# Patient Record
Sex: Female | Born: 1995
Health system: Southern US, Community
[De-identification: ages and names within clinical notes are randomized; demographics above are authoritative.]

## PROBLEM LIST (undated history)

## (undated) DIAGNOSIS — F32A Depression, unspecified: Secondary | ICD-10-CM

## (undated) DIAGNOSIS — U071 COVID-19: Secondary | ICD-10-CM

## (undated) DIAGNOSIS — D649 Anemia, unspecified: Secondary | ICD-10-CM

## (undated) DIAGNOSIS — R519 Headache, unspecified: Secondary | ICD-10-CM

## (undated) DIAGNOSIS — F419 Anxiety disorder, unspecified: Secondary | ICD-10-CM

## (undated) HISTORY — DX: Headache, unspecified: R51.9

## (undated) HISTORY — DX: COVID-19: U07.1

## (undated) HISTORY — DX: Anemia, unspecified: D64.9

## (undated) HISTORY — PX: NO PAST SURGERIES: SHX2092

## (undated) HISTORY — DX: Anxiety disorder, unspecified: F41.9

## (undated) HISTORY — DX: Depression, unspecified: F32.A

---
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
## 2018-08-18 DIAGNOSIS — H52221 Regular astigmatism, right eye: Secondary | ICD-10-CM | POA: Diagnosis not present

## 2018-08-18 DIAGNOSIS — H5212 Myopia, left eye: Secondary | ICD-10-CM | POA: Diagnosis not present

## 2019-01-02 DIAGNOSIS — Z681 Body mass index (BMI) 19 or less, adult: Secondary | ICD-10-CM | POA: Diagnosis not present

## 2019-01-02 DIAGNOSIS — N760 Acute vaginitis: Secondary | ICD-10-CM | POA: Diagnosis not present

## 2019-01-02 DIAGNOSIS — Z113 Encounter for screening for infections with a predominantly sexual mode of transmission: Secondary | ICD-10-CM | POA: Diagnosis not present

## 2019-01-02 DIAGNOSIS — Z01419 Encounter for gynecological examination (general) (routine) without abnormal findings: Secondary | ICD-10-CM | POA: Diagnosis not present

## 2019-01-02 DIAGNOSIS — Z3202 Encounter for pregnancy test, result negative: Secondary | ICD-10-CM | POA: Diagnosis not present

## 2019-12-20 ENCOUNTER — Ambulatory Visit: Payer: Self-pay | Attending: Internal Medicine

## 2019-12-20 ENCOUNTER — Ambulatory Visit: Payer: Self-pay

## 2020-01-14 ENCOUNTER — Ambulatory Visit: Payer: Self-pay | Attending: Internal Medicine

## 2020-01-17 ENCOUNTER — Ambulatory Visit: Payer: Self-pay

## 2020-01-17 ENCOUNTER — Ambulatory Visit: Payer: Self-pay | Attending: Internal Medicine

## 2020-04-21 DIAGNOSIS — L989 Disorder of the skin and subcutaneous tissue, unspecified: Secondary | ICD-10-CM | POA: Diagnosis not present

## 2020-04-21 DIAGNOSIS — G43009 Migraine without aura, not intractable, without status migrainosus: Secondary | ICD-10-CM | POA: Diagnosis not present

## 2020-04-21 DIAGNOSIS — Z13220 Encounter for screening for lipoid disorders: Secondary | ICD-10-CM | POA: Diagnosis not present

## 2020-04-21 DIAGNOSIS — Z0000 Encounter for general adult medical examination without abnormal findings: Secondary | ICD-10-CM | POA: Diagnosis not present

## 2020-04-21 DIAGNOSIS — F411 Generalized anxiety disorder: Secondary | ICD-10-CM | POA: Diagnosis not present

## 2020-05-30 DIAGNOSIS — R718 Other abnormality of red blood cells: Secondary | ICD-10-CM | POA: Diagnosis not present

## 2020-08-05 DIAGNOSIS — J069 Acute upper respiratory infection, unspecified: Secondary | ICD-10-CM | POA: Diagnosis not present

## 2020-08-05 DIAGNOSIS — R079 Chest pain, unspecified: Secondary | ICD-10-CM | POA: Diagnosis not present

## 2020-08-20 DIAGNOSIS — R309 Painful micturition, unspecified: Secondary | ICD-10-CM | POA: Diagnosis not present

## 2020-08-20 DIAGNOSIS — Z113 Encounter for screening for infections with a predominantly sexual mode of transmission: Secondary | ICD-10-CM | POA: Diagnosis not present

## 2020-08-20 DIAGNOSIS — N898 Other specified noninflammatory disorders of vagina: Secondary | ICD-10-CM | POA: Diagnosis not present

## 2020-09-02 DIAGNOSIS — F419 Anxiety disorder, unspecified: Secondary | ICD-10-CM | POA: Diagnosis not present

## 2020-11-26 DIAGNOSIS — H52223 Regular astigmatism, bilateral: Secondary | ICD-10-CM | POA: Diagnosis not present

## 2021-04-03 ENCOUNTER — Emergency Department (HOSPITAL_COMMUNITY)
Admission: EM | Admit: 2021-04-03 | Discharge: 2021-04-03 | Disposition: A | Discharge status: Home / Self Care | Payer: 59 | Attending: Emergency Medicine | Admitting: Emergency Medicine

## 2021-04-03 ENCOUNTER — Encounter (HOSPITAL_COMMUNITY): Payer: Self-pay

## 2021-04-03 ENCOUNTER — Other Ambulatory Visit (INDEPENDENT_AMBULATORY_CARE_PROVIDER_SITE_OTHER)
Admission: EM | Admit: 2021-04-03 | Discharge: 2021-04-04 | Disposition: A | Discharge status: Home / Self Care | Payer: 59 | Source: Home / Self Care | Attending: Psychiatry | Admitting: Psychiatry

## 2021-04-03 ENCOUNTER — Other Ambulatory Visit (HOSPITAL_COMMUNITY)
Admission: RE | Admit: 2021-04-03 | Discharge: 2021-04-03 | Disposition: A | Discharge status: Home / Self Care | Payer: 59 | Source: Ambulatory Visit | Attending: Nurse Practitioner | Admitting: Nurse Practitioner

## 2021-04-03 ENCOUNTER — Other Ambulatory Visit: Payer: Self-pay

## 2021-04-03 DIAGNOSIS — F411 Generalized anxiety disorder: Secondary | ICD-10-CM

## 2021-04-03 DIAGNOSIS — Z7289 Other problems related to lifestyle: Secondary | ICD-10-CM | POA: Insufficient documentation

## 2021-04-03 DIAGNOSIS — F332 Major depressive disorder, recurrent severe without psychotic features: Secondary | ICD-10-CM | POA: Diagnosis not present

## 2021-04-03 DIAGNOSIS — F419 Anxiety disorder, unspecified: Secondary | ICD-10-CM

## 2021-04-03 DIAGNOSIS — F322 Major depressive disorder, single episode, severe without psychotic features: Secondary | ICD-10-CM | POA: Insufficient documentation

## 2021-04-03 DIAGNOSIS — R000 Tachycardia, unspecified: Secondary | ICD-10-CM | POA: Insufficient documentation

## 2021-04-03 DIAGNOSIS — Z79899 Other long term (current) drug therapy: Secondary | ICD-10-CM | POA: Insufficient documentation

## 2021-04-03 DIAGNOSIS — Z20822 Contact with and (suspected) exposure to covid-19: Secondary | ICD-10-CM | POA: Diagnosis not present

## 2021-04-03 DIAGNOSIS — R45851 Suicidal ideations: Secondary | ICD-10-CM | POA: Diagnosis not present

## 2021-04-03 LAB — BASIC METABOLIC PANEL
Anion gap: 8 (ref 5–15)
BUN: 10 mg/dL (ref 6–20)
CO2: 23 mmol/L (ref 22–32)
Calcium: 8.7 mg/dL — ABNORMAL LOW (ref 8.9–10.3)
Chloride: 106 mmol/L (ref 98–111)
Creatinine, Ser: 0.63 mg/dL (ref 0.44–1.00)
GFR, Estimated: >60 mL/min (ref >60)
Glucose, Bld: 100 mg/dL — ABNORMAL HIGH (ref 70–99)
Potassium: 3.6 mmol/L (ref 3.5–5.1)
Sodium: 137 mmol/L (ref 135–145)

## 2021-04-03 LAB — CBC
HCT: 37.8 % (ref 36.0–46.0)
Hemoglobin: 12.2 g/dL (ref 12.0–15.0)
MCH: 25.2 pg — ABNORMAL LOW (ref 26.0–34.0)
MCHC: 32.3 g/dL (ref 30.0–36.0)
MCV: 77.9 fL — ABNORMAL LOW (ref 80.0–100.0)
Platelets: 290 10*3/uL (ref 150–400)
RBC: 4.85 MIL/uL (ref 3.87–5.11)
RDW: 14.3 % (ref 11.5–15.5)
WBC: 7.4 10*3/uL (ref 4.0–10.5)
nRBC: 0 % (ref 0.0–0.2)

## 2021-04-03 LAB — RESP PANEL BY RT-PCR (FLU A&B, COVID) ARPGX2
Influenza A by PCR: NEGATIVE
Influenza B by PCR: NEGATIVE
SARS Coronavirus 2 by RT PCR: NEGATIVE

## 2021-04-03 LAB — LIPID PANEL
Cholesterol: 216 mg/dL — ABNORMAL HIGH (ref 0–200)
HDL: 79 mg/dL (ref >40)
LDL Cholesterol: 131 mg/dL — ABNORMAL HIGH (ref 0–99)
Total CHOL/HDL Ratio: 2.7 RATIO
Triglycerides: 32 mg/dL (ref <150)
VLDL: 6 mg/dL (ref 0–40)

## 2021-04-03 LAB — RAPID URINE DRUG SCREEN, HOSP PERFORMED
Amphetamines: NOT DETECTED
Barbiturates: NOT DETECTED
Benzodiazepines: NOT DETECTED
Cocaine: NOT DETECTED
Opiates: NOT DETECTED
Tetrahydrocannabinol: NOT DETECTED

## 2021-04-03 LAB — I-STAT BETA HCG BLOOD, ED (MC, WL, AP ONLY): I-stat hCG, quantitative: <5 m[IU]/mL (ref <5)

## 2021-04-03 LAB — ETHANOL: Alcohol, Ethyl (B): <10 mg/dL (ref <10)

## 2021-04-03 MED ORDER — TRAZODONE HCL 50 MG PO TABS
50.0000 mg | ORAL_TABLET | Freq: Every evening | ORAL | Status: DC | PRN
Start: 2021-04-03 — End: 2021-04-04
  Administered 2021-04-03: 50 mg via ORAL
  Filled 2021-04-03: qty 1

## 2021-04-03 MED ORDER — HYDROXYZINE HCL 25 MG PO TABS: 25.0000 mg | ORAL_TABLET | Freq: Three times a day (TID) | ORAL | Status: DC | PRN

## 2021-04-03 MED ORDER — ALUM & MAG HYDROXIDE-SIMETH 200-200-20 MG/5ML PO SUSP: 30.0000 mL | ORAL | Status: DC | PRN

## 2021-04-03 MED ORDER — MAGNESIUM HYDROXIDE 400 MG/5ML PO SUSP: 30.0000 mL | Freq: Every day | ORAL | Status: DC | PRN

## 2021-04-03 MED ORDER — FLUOXETINE HCL 10 MG PO CAPS
10.0000 mg | ORAL_CAPSULE | Freq: Every day | ORAL | Status: DC
  Administered 2021-04-04: 10 mg via ORAL
  Filled 2021-04-03: qty 1

## 2021-04-03 MED ORDER — ACETAMINOPHEN 325 MG PO TABS
650.0000 mg | ORAL_TABLET | Freq: Four times a day (QID) | ORAL | Status: DC | PRN
Start: 2021-04-03 — End: 2021-04-04

## 2021-04-03 NOTE — ED Provider Notes (Signed)
Behavioral Health Admission H&P [facility] & OBS)  Date: 04/03/21 Patient Name: Bethany Cox MRN: [id number] Chief Complaint: No chief complaint on file.     Diagnoses:  Final diagnoses:  MDD (major depressive disorder), single episode, severe , no psychosis ([place])  GAD (generalized anxiety disorder)    HPI: Bethany Cox is a 25 y.o. female who presented to [facility] due to worsening depression, anxiety, and SI without a plan. Patient was recommended or admission to [facility] by Brooke-Leevy-[place], NP.   On evaluation, the patient is alert and oriented x4.  She is pleasant and cooperative.  Her eye contact is good.  Speech is clear and coherent, normal pace, normal volume.  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  He states that she lives with her mother and her brother.  She reports a long-term relationship with her boyfriend of over 10 years.  She reports some difficulty in the relationship.  She reports this has triggered her anxiety and thoughts of self-harm.  She reports that her father died suddenly from a heart attack in September 26, 2017.  She states that she has had a difficult time since his death.  She reports flashbacks related to his death.  States that she continues to avoid going through the emergency department because it brings back memories of his death.  Patient reports having panic symptoms while at work.  States that she "fell out on the floor."  She reports panic symptoms of tremors, shortness of breath, and tachycardia.  Patient states that she sleeps approximately 3 to 4 hours per night and states that she has difficulty falling asleep and often does not fall asleep until around 4 AM and then has to get up around 7 AM.  She reports that her appetite fluctuates.  States that she lost a lot of weight when her father passed away in 09-26-17 but has recently started gaining weight back.  She endorses distractibility, impulsivity, and irritable  mood.  She denies risky behaviors, financial extravagance, increased self confidence, and excessive talking. Patient states that she has never received assistance for depression or anxiety until recently.  States that she recently sought counseling through [facility].  She has met with a counselor twice and the counselor recommended that she go to her PCP for possible medications.  States that she has never taken any psychotropics.  Patient reports intermittent suicidal ideations without intent or plan.  She denies a history of suicide attempts.  She denies homicidal ideations.  She denies auditory and visual hallucinations.  She does not appear to be responding to internal stimuli.  She reports occasional alcohol use.  She reports using marijuana in the past, but does not use on a regular basis.  Denies use of other substances.   Associated Signs/Symptoms: Depression Symptoms:  depressed mood, anhedonia, insomnia, fatigue, feelings of worthlessness/guilt, hopelessness, suicidal thoughts without plan, anxiety, panic attacks, loss of energy/fatigue,  Duration of Depression Symptoms: Greater than two weeks  (Hypo) Manic Symptoms:  Distractibility, Impulsivity, Irritable Mood, Anxiety Symptoms:  Excessive Worry, Panic Symptoms,-collapsed on floor, shortness of breath, tachycardia Psychotic Symptoms:   Denies Duration of Psychotic Symptoms: No data recorded PTSD Symptoms: Had a traumatic exposure:  father passed away suddenly due to MI in 26-Sep-2017 Re-experiencing:  Flashbacks Nightmares Avoidance:  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    PHQ 2-9:   [place] [facility] from 04/03/2021 in [place]  High Risk        Total Time spent with patient: 45 minutes  Musculoskeletal  Strength & Muscle Tone: within normal limits Gait & Station: normal Patient leans: N/A  Psychiatric Specialty Exam  Presentation General  Appearance: Appropriate for Environment; Neat Eye Contact:Good Speech:Clear and Coherent Speech Volume:Normal Handedness:No data recorded  Mood and Affect  Mood:Anxious; Depressed Affect:Congruent  Thought Process  Thought Processes:Coherent Descriptions of Associations:Intact Orientation:Full (Time, Place and Person) Thought Content:WDL Diagnosis of Schizophrenia or Schizoaffective disorder in past: No   Hallucinations:Hallucinations: None Ideas of Reference:None Suicidal Thoughts:Suicidal Thoughts: Yes, Active SI Active Intent and/or Plan: Without Intent; Without Plan Homicidal Thoughts:Homicidal Thoughts: No  Sensorium  Memory:Immediate Good; Recent Good; Remote Good Judgment:Fair Insight:Fair  Executive Functions  Concentration:Good Attention Span:Good Recall:Good Fund of Knowledge:Good Language:Good  Psychomotor Activity  Psychomotor Activity:Psychomotor Activity: Normal  Assets  Assets:Communication Skills; Desire for Improvement; Financial Resources/Insurance; Housing; Physical Health; Transportation  Sleep  Sleep:Sleep: Poor Number of Hours of Sleep: 4  Nutritional Assessment (For OBS and [facility]BC admissions only) Has the patient had a weight loss or gain of 10 pounds or more in the last 3 months?: No Has the patient had a decrease in food intake/or appetite?: No Does the patient have dental problems?: No Does the patient have eating habits or behaviors that may be indicators of an eating disorder including binging or inducing vomiting?: No Has the patient recently lost weight without trying?: No Has the patient been eating poorly because of a decreased appetite?: No Malnutrition Screening Tool Score: 0   Physical Exam Constitutional:      General: She is not in acute distress.    Appearance: She is not ill-appearing, toxic-appearing or diaphoretic.  HENT:     Head: Normocephalic.     Right Ear: External ear normal.     Left Ear: External ear normal.   Eyes:     Conjunctiva/sclera: Conjunctivae normal.     Pupils: Pupils are equal, round, and reactive to light.  Cardiovascular:     Rate and Rhythm: Normal rate.  Pulmonary:     Effort: Pulmonary effort is normal. No respiratory distress.  Musculoskeletal:        General: Normal range of motion.  Neurological:     Mental Status: She is alert and oriented to person, place, and time.  Psychiatric:        Mood and Affect: Mood is anxious and depressed.        Thought Content: Thought content is not paranoid or delusional. Thought content includes suicidal ideation. Thought content does not include homicidal ideation. Thought content includes suicidal plan.   Review of Systems  Constitutional:  Negative for chills, diaphoresis, fever, malaise/fatigue and weight loss.  HENT:  Negative for congestion.   Respiratory:  Negative for cough and shortness of breath.   Cardiovascular:  Negative for chest pain and palpitations.  Gastrointestinal:  Negative for diarrhea, nausea and vomiting.  Neurological:  Negative for dizziness and seizures.  Psychiatric/Behavioral:  Positive for depression and suicidal ideas. Negative for hallucinations, memory loss and substance abuse. The patient is nervous/anxious and has insomnia.   All other systems reviewed and are negative.  Last menstrual period 03/09/2021. There is no height or weight on file to calculate BMI.  Past Psychiatric History: Denies receiving psychiatric care in the past.   Is the patient at risk to self? Yes  Has the patient been a risk to self in the past 6 months? No .    Has  the patient been a risk to self within the distant past? No   Is the patient a risk to others? No   Has the patient been a risk to others in the past 6 months? No   Has the patient been a risk to others within the distant past? No   Past Medical History: No past medical history on file. No past surgical history on file.  Family History:  Family History   Problem Relation Age of Onset   Thyroid disease Mother     Social History:  Social History   Socioeconomic History   Marital status: Single    Spouse name: Not on file   Number of children: Not on file   Years of education: Not on file   Highest education level: Not on file  Occupational History   Not on file  Tobacco Use   Smoking status: Never   Smokeless tobacco: Never  Vaping Use   Vaping Use: Some days   Substances: Nicotine, Flavoring  Substance and Sexual Activity   Alcohol use: Yes   Drug use: Never   Sexual activity: Not on file  Other Topics Concern   Not on file  Social History Narrative   Not on file   Social Determinants of Health   Financial Resource Strain: Not on file  Food Insecurity: Not on file  Transportation Needs: Not on file  Physical Activity: Not on file  Stress: Not on file  Social Connections: Not on file  Intimate Partner Violence: Not on file    SDOH:  SDOH Screenings   Alcohol Screen: Not on file  Depression (PHQ2-9): Not on file  Financial Resource Strain: Not on file  Food Insecurity: Not on file  Housing: Not on file  Physical Activity: Not on file  Social Connections: Not on file  Stress: Not on file  Tobacco Use: Low Risk    Smoking Tobacco Use: Never   Smokeless Tobacco Use: Never  Transportation Needs: Not on file    Last Labs:  Admission on 04/03/2021, Discharged on 04/03/2021  Component Date Value Ref Range Status   SARS Coronavirus 2 by RT PCR 04/03/2021 NEGATIVE  NEGATIVE Final   Comment: (NOTE) SARS-CoV-2 target nucleic acids are NOT DETECTED.  The SARS-CoV-2 RNA is generally detectable in upper respiratory specimens during the acute phase of infection. The lowest concentration of SARS-CoV-2 viral copies this assay can detect is 138 copies/mL. A negative result does not preclude SARS-Cov-2 infection and should not be used as the sole basis for treatment or other patient management decisions. A negative  result may occur with  improper specimen collection/handling, submission of specimen other than nasopharyngeal swab, presence of viral mutation(s) within the areas targeted by this assay, and inadequate number of viral copies(<138 copies/mL). A negative result must be combined with clinical observations, patient history, and epidemiological information. The expected result is Negative.  Fact Sheet for Patients:  EntrepreneurPulse.com.au  Fact Sheet for Healthcare Providers:  IncredibleEmployment.be  This test is no                          t yet approved or cleared by the [place] FDA and  has been authorized for detection and/or diagnosis of SARS-CoV-2 by FDA under an Emergency Use Authorization (EUA). This EUA will remain  in effect (meaning this test can be used) for the duration of the COVID-19 declaration under Section 564(b)(1) of the Act, 21 U.S.C.section 360bbb-3(b)(1), unless the  authorization is terminated  or revoked sooner.       Influenza A by PCR 04/03/2021 NEGATIVE  NEGATIVE Final   Influenza B by PCR 04/03/2021 NEGATIVE  NEGATIVE Final   Comment: (NOTE) The Xpert Xpress SARS-CoV-2/FLU/RSV plus assay is intended as an aid in the diagnosis of influenza from Nasopharyngeal swab specimens and should not be used as a sole basis for treatment. Nasal washings and aspirates are unacceptable for Xpert Xpress SARS-CoV-2/FLU/RSV testing.  Fact Sheet for Patients: EntrepreneurPulse.com.au  Fact Sheet for Healthcare Providers: IncredibleEmployment.be  This test is not yet approved or cleared by the [place] FDA and has been authorized for detection and/or diagnosis of SARS-CoV-2 by FDA under an Emergency Use Authorization (EUA). This EUA will remain in effect (meaning this test can be used) for the duration of the COVID-19 declaration under Section 564(b)(1) of the Act, 21 U.S.C. section  360bbb-3(b)(1), unless the authorization is terminated or revoked.  Performed at [facility], [place] [address] [place], [place] [address]    Opiates 04/03/2021 NONE DETECTED  NONE DETECTED Final   Cocaine 04/03/2021 NONE DETECTED  NONE DETECTED Final   Benzodiazepines 04/03/2021 NONE DETECTED  NONE DETECTED Final   Amphetamines 04/03/2021 NONE DETECTED  NONE DETECTED Final   Tetrahydrocannabinol 04/03/2021 NONE DETECTED  NONE DETECTED Final   Barbiturates 04/03/2021 NONE DETECTED  NONE DETECTED Final   Comment: (NOTE) DRUG SCREEN FOR MEDICAL PURPOSES ONLY.  IF CONFIRMATION IS NEEDED FOR ANY PURPOSE, NOTIFY LAB WITHIN 5 DAYS.  LOWEST DETECTABLE LIMITS FOR URINE DRUG SCREEN Drug Class                     Cutoff (ng/mL) Amphetamine and metabolites    1000 Barbiturate and metabolites    200 Benzodiazepine                 433 Tricyclics and metabolites     300 Opiates and metabolites        300 Cocaine and metabolites        300 THC                            50 Performed at [facility], [place] [address], [place], [place] [address]    Sodium 04/03/2021 137  135 - 145 mmol/L Final   Potassium 04/03/2021 3.6  3.5 - 5.1 mmol/L Final   Chloride 04/03/2021 106  98 - 111 mmol/L Final   CO2 04/03/2021 23  22 - 32 mmol/L Final   Glucose, Bld 04/03/2021 100 (A) 70 - 99 mg/dL Final   Glucose reference range applies only to samples taken after fasting for at least 8 hours.   BUN 04/03/2021 10  6 - 20 mg/dL Final   Creatinine, Ser 04/03/2021 0.63  0.44 - 1.00 mg/dL Final   Calcium 04/03/2021 8.7 (A) 8.9 - 10.3 mg/dL Final   GFR, Estimated 04/03/2021 >60  >60 mL/min Final   Comment: (NOTE) Calculated using the CKD-EPI Creatinine Equation (2021)    Anion gap 04/03/2021 8  5 - 15 Final   Performed at [facility], [place] [address] [place], [place] [address]   Alcohol, Ethyl (B) 04/03/2021 <10  <10 mg/dL Final   Comment: (NOTE) Lowest  detectable limit for serum alcohol is 10 mg/dL.  For medical purposes only. Performed at [facility], [place] [address], [place], [place] [address]    WBC 04/03/2021 7.4  4.0 - 10.5 K/uL Final   RBC 04/03/2021 4.85  3.87 - 5.11 MIL/uL Final   Hemoglobin 04/03/2021 12.2  12.0 - 15.0 g/dL Final   HCT 04/03/2021 37.8  36.0 - 46.0 % Final   MCV 04/03/2021 77.9 (A) 80.0 - 100.0 fL Final   MCH 04/03/2021 25.2 (A) 26.0 - 34.0 pg Final   MCHC 04/03/2021 32.3  30.0 - 36.0 g/dL Final   RDW 04/03/2021 14.3  11.5 - 15.5 % Final   Platelets 04/03/2021 290  150 - 400 K/uL Final   nRBC 04/03/2021 0.0  0.0 - 0.2 % Final   Performed at [facility], [place] [address], [place], [place] [address]   I-stat hCG, quantitative 04/03/2021 <5.0  <5 mIU/mL Final   Comment 3 04/03/2021          Final   Comment:   GEST. AGE      CONC.  (mIU/mL)   <=1 WEEK        5 - 50     2 WEEKS       50 - 500     3 WEEKS       100 - 10,000     4 WEEKS     1,000 - 30,000        FEMALE AND NON-PREGNANT FEMALE:     LESS THAN 5 mIU/mL     Allergies: Patient has no allergy information on record.  PTA Medications: (Not in a hospital admission)   Medical Decision Making  Patient was medically cleared in the ED  Admit to [facility] for crisis stabilization  Fluoxetine: the patient was informed of possible adverse effects including, but not limited to: weight gain, headaches, dizziness, nausea/vomiting, abdominal pain, diarrhea/constipation, tremors, muscle pain/aches. This includes black box warning of emergence of suicidal ideations. The patient expressed understanding. Alternatives to this medication were also discussed with the patient, including risks of not taking medications, and the patient was agreeable to the above choice.     Start fluoxetine 10 mg daily for depression/anxiety Start hydroxyzine 25 mg TID prn for anxiety Start trazodone 50 mg QHS prn for sleep   Clinical Course as of  04/04/21 0135  Sat Apr 04, 2021  0045 Cholesterol(!): 216 Cholesterol slightly elevated at 216 [JB]  0045 Ordered TSH, lipid panel, HIV testing, RPR, Lab reported only having enough blood for lipid panel. Patient declined additional blood draw. [JB]    Clinical Course User Index [JB] Rozetta Nunnery, NP    Recommendations  Based on my evaluation the patient does not appear to have an emergency medical condition.  Rozetta Nunnery, NP 04/03/21  9:47 PM

## 2021-04-03 NOTE — ED Triage Notes (Signed)
Patient states she does not want to be alone at her home and did state that she does not know what she would do to herself. Patient denies having a plan. Patient is tearful in triage.

## 2021-04-03 NOTE — ED Notes (Signed)
Pt resting on bed watching tv.

## 2021-04-03 NOTE — ED Notes (Signed)
Pt is given a sandwich and water

## 2021-04-03 NOTE — ED Notes (Signed)
This patient is voluntary and would like to leave now. She has a bed at [facility]. MD notified of patient's request. MD back to talk to patient

## 2021-04-03 NOTE — BH Assessment (Signed)
[facility] Assessment Progress Note   Per Maxie Barb, NP, pt is to be transferred to the [facility].  Pt has been accepted by Dr Bronwen Betters.  Please call report to [phone].  Pt has signed consents, which have been faxed to [phone].  Pt is to be transported via [employer].  EDP Alvira Monday, MD and pt's nurse, Morrie Sheldon, have been notified.   Doylene Canning, [place] Behavioral Health Coordinator [phone]

## 2021-04-03 NOTE — BH Assessment (Signed)
Comprehensive Clinical Assessment (CCA) Note  04/03/2021 Bethany Cox [id number]  DISPOSITION: Per Starkes FNP patient meets inpatient criteria as bed placement is investigated.   [place] ED from 04/03/2021 in [facility] DEPT  C-SSRS RISK CATEGORY High Risk      The patient demonstrates the following risk factors for suicide: Chronic risk factors for suicide include: psychiatric disorder of depression . Acute risk factors for suicide include: family or marital conflict. Protective factors for this patient include: coping skills. Considering these factors, the overall suicide risk at this point appears to be high. Patient is not appropriate for outpatient follow up.   Patient is a 25 year old female that presents this date with ongoing S/I although denies any specific plan or intent. Patient denies any H/I or AVH. 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 Patient states she currently resides with her mother and brother. Patient states she has been involved in a ongoing relationship with her partner (boyfriend) for over 10 years and reports ongoing thoughts of self harm after a verbal altercation earlier this date. Patient reports additional stressors to include a strained relationship with her mother who is [place] stating parent does not understand what she is going through with patient reporting she feels it may be cultural. Patient also reports that her father passed away in 10/01/2017. Patient reports she is currently receiving counseling through the [facility] at [facility] although patient states after the incident that occurred prior to arrival she may need assistance with possibly medication management and a more intense level of counseling. Patient denies any current SA issues. Patient denies any history of abuse or access to firearms. Patient reports one prior attempt to self harm when she was a teenager stating she attempted to hang herself  although did not follow through with that plan and did not require hospitalization. Patient also reports a history of cutting during that time also although patient reports that self resolved. Patient denies any current episodes of that behavior. Patient reports she has been diagnosed with depression although has never received any medications or counseling with exception of current EAP counselor which she states she has met with twice. Patient states she has been suffering from ongoing depression for the last two years with symptoms intensifying over the last two months to include: feeling hopeless, excessive fatigue and sleep hygiene (patient states she usually does not fall asleep until 4 in the morning every night). Patient denies any mental health history other than listed above. Patient reports she has never been prescribed any medications for symptom management and is requesting to be evaluated for possible medication interventions. Patient states after the altercation with her partner he contacted a [facility] who arrived at her residence and transported her to ED. Patient states at this time she does not feel safe to return home.              Pearline Cables DO writes on arrival: 25 year old female with history as below presented ER secondary to suicidal thoughts.  Patient history of prior self injury in the past, prior suicide attempt multiple years ago she was a child with hanging.  Patient with worsening anxiety over the past few weeks.  Difficult social relationships.  Recent death of family member.  Patient does not feel safe by herself and is worried that she may try to harm her self if she is left alone.  She was eval by counselor and was advised to contact her PCP  regarding initiation of possible antidepressant medications.  PCP is unavailable/out of town and patient has been unable to get in touch with her in the past few weeks.  No hallucinations or delusions, no homicidal ideation.  Suicidal but has no plan.  No  recent illicit drug use, no alcohol use.  History of prior marijuana use but none in the past 6 weeks.  No acute medical complaints offered.  Patient is oriented x 5. Patient is alert and cooperative with this Probation officer. Patient speaks in a normal voice and makes good eye contact. Patient's UDS is negative and BAL is less than five. Patient's memory appears to be intact with thoughts organized. Patient does not appear to be responding to internal stimuli.    Chief Complaint:  Chief Complaint  Patient presents with   Anxiety   Visit Diagnosis: MDD recurrent without psychotic features, severe     CCA Screening, Triage and Referral (STR)  Patient Reported Information How did you hear about [place]? Self  What Is the Reason for Your Visit/Call Today? Pt has ongoing S/I  How Long Has This Been Causing You Problems? <Week  What Do You Feel Would Help You the Most Today? Stress Management; Medication(s)   Have You Recently Had Any Thoughts About Hurting Yourself? Yes  Are You Planning to Commit Suicide/Harm Yourself At This time? No   Have you Recently Had Thoughts About [place]? No  Are You Planning to Harm Someone at This Time? No  Explanation: No data recorded  Have You Used Any Alcohol or Drugs in the Past 24 Hours? No  How Long Ago Did You Use Drugs or Alcohol? No data recorded What Did You Use and How Much? No data recorded  Do You Currently Have a Therapist/Psychiatrist? No  Name of Therapist/Psychiatrist: No data recorded  Have You Been Recently Discharged From Any Office Practice or Programs? No  Explanation of Discharge From Practice/Program: No data recorded    CCA Screening Triage Referral Assessment Type of Contact: Face-to-Face  Telemedicine Service Delivery:   Is this Initial or Reassessment? No data recorded Date Telepsych consult ordered in CHL:  No data recorded Time Telepsych consult ordered in CHL:  No data recorded Location of Assessment: [facility]  ED  Provider Location: Other (comment) ([facility])   Collateral Involvement: None at this time   Does Patient Have a [place]? No data recorded Name and Contact of Legal Guardian: No data recorded If Minor and Not Living with Parent(s), Who has Custody? NA  Is CPS involved or ever been involved? Never  Is APS involved or ever been involved? Never   Patient Determined To Be At Risk for Harm To Self or Others Based on Review of Patient Reported Information or Presenting Complaint? Yes, for Self-Harm  Method: No data recorded Availability of Means: No data recorded Intent: No data recorded Notification Required: No data recorded Additional Information for Danger to Others Potential: No data recorded Additional Comments for Danger to Others Potential: No data recorded Are There Guns or Other Weapons in Your Home? No data recorded Types of Guns/Weapons: No data recorded Are These Weapons Safely Secured?                            No data recorded Who Could Verify You Are Able To Have These Secured: No data recorded Do You Have any Outstanding Charges, Pending Court Dates, Parole/Probation? No data recorded Contacted To Inform  of Risk of Harm To Self or Others: Other: Comment (NA)    Does Patient Present under Involuntary Commitment? No  IVC Papers Initial File Date: No data recorded  [place] of Residence: [place]   Patient Currently Receiving the Following Services: Not Receiving Services   Determination of Need: Urgent (48 hours)   Options For Referral: Outpatient Therapy     CCA Biopsychosocial Patient Reported Schizophrenia/Schizoaffective Diagnosis in Past: No   Strengths: Pt is willing to participate in treatment   Mental Health Symptoms Depression:   Change in energy/activity; Fatigue   Duration of Depressive symptoms:  Duration of Depressive Symptoms: Greater than two weeks   Mania:   None   Anxiety:    Fatigue; Restlessness    Psychosis:   None   Duration of Psychotic symptoms:    Trauma:   None   Obsessions:  No data recorded  Compulsions:   None   Inattention:   None   Hyperactivity/Impulsivity:   None   Oppositional/Defiant Behaviors:   None   Emotional Irregularity:   Chronic feelings of emptiness   Other Mood/Personality Symptoms:   None noted    Mental Status Exam Appearance and self-care  Stature:   Average   Weight:   Average weight   Clothing:   Neat/clean   Grooming:   Normal   Cosmetic use:   None   Posture/gait:   Normal   Motor activity:   Not Remarkable   Sensorium  Attention:   Normal   Concentration:   Normal   Orientation:   X5   Recall/memory:   Normal   Affect and Mood  Affect:   Anxious; Depressed   Mood:   Depressed   Relating  Eye contact:   Normal   Facial expression:   Sad   Attitude toward examiner:   Cooperative   Thought and Language  Speech flow:  Clear and Coherent   Thought content:   Appropriate to Mood and Circumstances   Preoccupation:  No data recorded  Hallucinations:   None   Organization:  No data recorded  [employer] of Knowledge:   Good   Intelligence:   Above Average   Abstraction:   Normal   Judgement:   Good   Reality Testing:   Realistic   Insight:   Good   Decision Making:   Normal   Social Functioning  Social Maturity:   Responsible   Social Judgement:   Normal   Stress  Stressors:   Relationship; Family conflict   Coping Ability:   Exhausted   Skill Deficits:   Decision making   Supports:   Family     Religion: Religion/Spirituality Are You A Religious Person?: No  Leisure/Recreation: Leisure / Recreation Do You Have Hobbies?: No  Exercise/Diet: Exercise/Diet Do You Exercise?: No Have You Gained or Lost A Significant Amount of Weight in the Past Six Months?: No Do You Follow a Special Diet?: No Do You Have Any Trouble Sleeping?:  No   CCA Employment/Education Employment/Work Situation: Employment / Work Situation Employment Situation: Employed Work Stressors: Ongoing stress associated with relationship issues which has affected her ability to stay on task at work Patient's Job has Been Impacted by Current Illness: No Has Patient ever Been in the [employer]?: No  Education: Education Is Patient Currently Attending School?: No   CCA Family/Childhood History Family and Relationship History: Family history Marital status: Long term relationship Long term relationship, how long?: 10 years What types of issues  is patient dealing with in the relationship?: Communication issues Additional relationship information: None at this time Does patient have children?: No  Childhood History:  Childhood History By whom was/is the patient raised?: Both parents Did patient suffer any verbal/emotional/physical/sexual abuse as a child?: No Did patient suffer from severe childhood neglect?: No Has patient ever been sexually abused/assaulted/raped as an adolescent or adult?: No Was the patient ever a victim of a crime or a disaster?: No Witnessed domestic violence?: No Has patient been affected by domestic violence as an adult?: No  Child/Adolescent Assessment:     CCA Substance Use Alcohol/Drug Use: Alcohol / Drug Use Pain Medications: See MAR Prescriptions: See MAR Over the Counter: See MAR History of alcohol / drug use?: No history of alcohol / drug abuse                         ASAM's:  Six Dimensions of Multidimensional Assessment  Dimension 1:  Acute Intoxication and/or Withdrawal Potential:      Dimension 2:  Biomedical Conditions and Complications:      Dimension 3:  Emotional, Behavioral, or Cognitive Conditions and Complications:     Dimension 4:  Readiness to Change:     Dimension 5:  Relapse, Continued use, or Continued Problem Potential:     Dimension 6:  Recovery/Living Environment:      ASAM Severity Score:    ASAM Recommended Level of Treatment:     Substance use Disorder (SUD)    Recommendations for Services/Supports/Treatments:    Discharge Disposition:    DSM5 Diagnoses: There are no problems to display for this patient.    Referrals to Alternative Service(s): Referred to Alternative Service(s):   Place:   Date:   Time:    Referred to Alternative Service(s):   Place:   Date:   Time:    Referred to Alternative Service(s):   Place:   Date:   Time:    Referred to Alternative Service(s):   Place:   Date:   Time:     Mamie Nick, LCAS

## 2021-04-03 NOTE — Discharge Instructions (Addendum)
Please contact one of the following facilities to start medication management and therapy services:   [facility] [address]  [place], [place] [address] [phone]   [facility]  [address] [place], [place] [address] [phone]  [facility]  [address], [place], [place] [address] [phone]  [facility]  [address]  [place], [place] [address] [phone]  [facility]  [address] [place], [place] [address] [phone]  [facility]  [address],  , [place] [address] [phone]  Take all of you medications as prescribed by your mental healthcare provider.  Report any adverse effects and reactions from your medications to your outpatient provider promptly.  Do not engage in alcohol and or illegal drug use while on prescription medicines.  Keep all scheduled appointments. This is to ensure that you are getting refills on time and to avoid any interruption in your medication. If you are unable to keep an appointment call to reschedule.  Be sure to follow up with resources and follow ups given.  In the event of worsening symptoms call the crisis hotline, 911, and or go to the nearest emergency department for appropriate evaluation and treatment of symptoms. Follow-up with your primary care provider for your medical issues, concerns and or health care needs.    

## 2021-04-03 NOTE — ED Provider Notes (Signed)
[facility]EMERGENCY DEPT Provider Note   CSN: [id number] Arrival date & time: 04/03/21  0919     History Chief Complaint  Patient presents with   Anxiety    Bethany Cox is a 25 y.o. female.  25 year old female with history as below presented ER secondary to suicidal thoughts.  Patient history of prior self injury in the past, prior suicide attempt multiple years ago she was a child with hanging.  Patient with worsening anxiety over the past few weeks.  Difficult social relationships.  Recent death of family member.  Patient does not feel safe by herself and is worried that she may try to harm her self if she is left alone.  She was eval by counselor and was advised to contact her PCP regarding initiation of possible antidepressant medications.  PCP is unavailable/out of town and patient has been unable to get in touch with her in the past few weeks.  No hallucinations or delusions, no homicidal ideation.  Suicidal but has no plan.  No recent illicit drug use, no alcohol use.  History of prior marijuana use but none in the past 6 weeks.  No acute medical complaints offered.  The history is provided by the patient. No language interpreter was used.  Anxiety Pertinent negatives include no chest pain, no abdominal pain, no headaches and no shortness of breath.      History reviewed. No pertinent past medical history.  Patient Active Problem List   Diagnosis Date Noted   Severe major depression, single episode (HCC) 04/03/2021     History reviewed. No pertinent surgical history.   OB History   No obstetric history on file.     Family History  Problem Relation Age of Onset   Thyroid disease Mother     Social History   Tobacco Use   Smoking status: Never   Smokeless tobacco: Never  Vaping Use   Vaping Use: Some days   Substances: Nicotine, Flavoring  Substance Use Topics   Alcohol use: Yes   Drug use: Never    Home Medications Prior to  Admission medications   Medication Sig Start Date End Date Taking? Authorizing Provider  FLUoxetine (PROZAC) 20 MG capsule Take 1 capsule (20 mg total) by mouth daily. 04/05/21   White, Patrice L, NP  hydrOXYzine (ATARAX/VISTARIL) 25 MG tablet Take 1 tablet (25 mg total) by mouth 3 (three) times daily as needed for anxiety. 04/04/21   White, Patrice L, NP  melatonin 5 MG TABS Take 5 mg by mouth at bedtime as needed.    [provider]    Allergies    Patient has no known allergies.  Review of Systems   Review of Systems  Constitutional:  Negative for activity change and fever.  HENT:  Negative for facial swelling and trouble swallowing.   Eyes:  Negative for discharge and redness.  Respiratory:  Negative for cough and shortness of breath.   Cardiovascular:  Negative for chest pain and palpitations.  Gastrointestinal:  Negative for abdominal pain and nausea.  Genitourinary:  Negative for dysuria and flank pain.  Musculoskeletal:  Negative for back pain and gait problem.  Skin:  Negative for pallor and rash.  Neurological:  Negative for syncope and headaches.  Psychiatric/Behavioral:  Positive for suicidal ideas. The patient is nervous/anxious.    Physical Exam Updated Vital Signs BP 113/79 (BP Location: Left Arm)   Pulse 84   Temp 98 F (36.7 C) (Oral)   Resp 16  Ht 5\' 2"  (1.575 m)   Wt 56.7 kg   LMP 03/09/2021 (Exact Date)   SpO2 94%   BMI 22.86 kg/m   Physical Exam Vitals and nursing note reviewed.  Constitutional:      General: She is not in acute distress.    Appearance: Normal appearance.  HENT:     Head: Normocephalic and atraumatic.     Right Ear: External ear normal.     Left Ear: External ear normal.     Nose: Nose normal.     Mouth/Throat:     Mouth: Mucous membranes are moist.  Eyes:     General: No scleral icterus.       Right eye: No discharge.        Left eye: No discharge.  Cardiovascular:     Rate and Rhythm: Regular rhythm. Tachycardia  present.     Pulses: Normal pulses.     Heart sounds: Normal heart sounds.  Pulmonary:     Effort: Pulmonary effort is normal. No respiratory distress.     Breath sounds: Normal breath sounds.  Abdominal:     General: Abdomen is flat.     Tenderness: There is no abdominal tenderness.  Musculoskeletal:        General: Normal range of motion.     Cervical back: Normal range of motion.     Right lower leg: No edema.     Left lower leg: No edema.  Skin:    General: Skin is warm and dry.     Capillary Refill: Capillary refill takes less than 2 seconds.  Neurological:     Mental Status: She is alert.  Psychiatric:        Attention and Perception: Attention normal.        Mood and Affect: Mood is anxious and depressed. Affect is tearful.        Behavior: Behavior normal. Behavior is cooperative.        Thought Content: Thought content includes suicidal ideation. Thought content does not include homicidal ideation. Thought content does not include homicidal or suicidal plan.    ED Results / Procedures / Treatments   Labs (all labs ordered are listed, but only abnormal results are displayed) Labs Reviewed  BASIC METABOLIC PANEL - Abnormal; Notable for the following components:      Result Value   Glucose, Bld 100 (*)    Calcium 8.7 (*)    All other components within normal limits  CBC - Abnormal; Notable for the following components:   MCV 77.9 (*)    MCH 25.2 (*)    All other components within normal limits  RESP PANEL BY RT-PCR (FLU A&B, COVID) ARPGX2  RAPID URINE DRUG SCREEN, HOSP PERFORMED  ETHANOL  I-STAT BETA HCG BLOOD, ED ([facility], [facility], [facility] ONLY)    EKG EKG Interpretation  Date/Time:  Friday April 03 2021 13:00:37 EDT Ventricular Rate:  82 PR Interval:  140 QRS Duration: 93 QT Interval:  397 QTC Calculation: 464 R Axis:   99 Text Interpretation: Sinus rhythm Likely lead reversal Borderline right axis deviation TWI to V3 No previous ECGs available Confirmed by [id number] ([id number]) on 04/03/2021 1:34:30 PM  Radiology No results found.  Procedures Procedures   Medications Ordered in ED Medications - No data to display  ED Course  I have reviewed the triage vital signs and the nursing notes.  Pertinent labs & imaging results that were available during my care of the patient were reviewed by me  and considered in my medical decision making (see chart for details).    MDM Rules/Calculators/A&P                           25 year old female with history as above presented ER secondary to mental health disturbance.  She has suicidal thoughts without clear plan.  Physical exam is reassuring, she is mildly tachycardic without chest pain or dyspnea, no palpitations.  She is anxious during evaluation.  Mildly tachypneic.  Vital signs otherwise are stable.  She is not acutely psychotic.  Serious etiology considered  Obtain psychiatric screening labs.  Sitter ordered.  Suicide precautions ordered.  Consult TTS  Labs reviewed and are stable.  She is medically cleared for psychiatric evaluation.    4:30 PM Behavioral health has evaluated patient, recommend inpatient placement.  Patient is VOLUNTARY.  She has no home medications.  Diet ordered.  Sitter in place. No acute distress currently, not acutely psychotic. Signed out to incoming team pending final disposition.    Final Clinical Impression(s) / ED Diagnoses Final diagnoses:  Anxiety  Suicidal thoughts    Rx / DC Orders ED Discharge Orders     None        Sloan Leiter, DO 04/04/21 1836

## 2021-04-03 NOTE — ED Notes (Signed)
[facility] transfer, pt presents with suicidal ideations, no specific plan noted.  Pt reports major stressor was verbal altercation with boyfriend of 10 years earlier today. Pt A&O x 4, no distress noted, skin search completed, unable to contract for safety.  Monitoring for safety.

## 2021-04-03 NOTE — ED Notes (Signed)
Safe transport contacted for transfer to [facility]

## 2021-04-04 DIAGNOSIS — F332 Major depressive disorder, recurrent severe without psychotic features: Secondary | ICD-10-CM | POA: Diagnosis not present

## 2021-04-04 DIAGNOSIS — F411 Generalized anxiety disorder: Secondary | ICD-10-CM | POA: Diagnosis not present

## 2021-04-04 MED ORDER — FLUOXETINE HCL 20 MG PO CAPS
20.0000 mg | ORAL_CAPSULE | Freq: Every day | ORAL | 1 refills | Status: DC
  Filled 2021-05-28: qty 30, 30d supply, fill #0

## 2021-04-04 MED ORDER — FLUOXETINE HCL 20 MG PO CAPS: 20.0000 mg | ORAL_CAPSULE | Freq: Every day | ORAL | Status: DC

## 2021-04-04 MED ORDER — HYDROXYZINE HCL 25 MG PO TABS: 25.0000 mg | ORAL_TABLET | Freq: Three times a day (TID) | ORAL | 0 refills | Status: DC | PRN

## 2021-04-04 NOTE — ED Notes (Signed)
Pt resting at present, no distress noted.  Monitoring for safety. 

## 2021-04-04 NOTE — ED Notes (Signed)
Pt sleeping in no acute distress. RR even and unlabored. Safety maintained. 

## 2021-04-04 NOTE — ED Notes (Signed)
Pt given lunch

## 2021-04-04 NOTE — Group Note (Signed)
Group Topic: Social Support  Group Date: 04/04/2021 Start Time: 1000 End Time: 1110 Facilitators: Loma Newton  Department: [facility]  Number of Participants: 3  Group Focus: other social support  Treatment Modality:  Patient-Centered Therapy Interventions utilized were support Purpose: express feelings This group session focused on social support and ways to improve social support. The patients were encouraged to think about their social support system, what actions make them feel supported,  barriers that prevent them from utilizing their support system, and improvements. Pt was active and cooperative during group and gave positive feedback.  Name: Bethany Cox Date of Birth: 06-Mar-1996  MR: [id number]    Level of Participation: active Quality of Participation: cooperative Interactions with others: gave feedback Mood/Affect: appropriate Triggers (if applicable): N/A Cognition: goal directed Progress: Gaining insight Response: positive Plan: patient will be encouraged to work on strengthening her support system and relationship with her family and friends and creating health boundaries.  Patients Problems:  Patient Active Problem List   Diagnosis Date Noted   Severe major depression, single episode (HCC) 04/03/2021

## 2021-04-04 NOTE — ED Notes (Signed)
Patient A&O x 4, ambulatory. Patient discharged in no acute distress. Patient denied SI/HI, A/VH upon discharge. Patient verbalized understanding of all discharge instructions explained by staff, to include follow up appointments, RX's and safety plan. Patient reported mood 10/10.  Pt belongings returned to patient from locker    intact. Patient escorted to lobby via staff for transport to destination via her cousin. Safety maintained.

## 2021-04-04 NOTE — ED Notes (Addendum)
Patient stated she would like to discharge today. Has a flight to [facility] tomorrow. She has also requested to speak with a provider or physician. Patient stated that she has not been on anti-depressants and would like to be. She denies SI, HI, and AVH. Informed to notify staff with any needs or concerns. Will continue to monitor for safety. Patient is currently in [facility], actively participating.

## 2021-04-04 NOTE — Consult Note (Signed)
[facility] Discharge Suicide Risk Assessment   Discharge Diagnoses: Active Problems:   Severe major depression, single episode (HCC)   Total Time spent with patient: 30 minutes   Subjective: "I feel okay today after I was able to get a lot of sleep last night. I am going to be honest. I came because my counselor told me to get evaluated so that I could get started on medications for my anxiety because my PCP was out of the office. I had a fight with my boyfriend and did not feel safe returning home because of the thoughts. I never had a suicide plan or intended to hurt myself. I live with my mother and brother, but my culture does not understand mental health. I would like to be discharged today because my family has planned to go to [facility] tomorrow to see my aunt who is ill. We will be flying out tomorrow afternoon."   Stay Summary: Patient seen and examined face to face by this provider and chart reviewed. Case discussed with Dr. Jannifer Franklin. On evaluation, patient is alert and oriented x4.  Her thought process is logical, and her speech is coherent. Her mood is euthymic, and affect is congruent. She has been calm and cooperative while on the unit. She has been medication compliant and denies any side effects to the medications. She has been compliant with attending groups and states that she had a really good group this morning and realizes that she has a lot of people who care about her and the importance of continuing counseling. She has good insight to continue current medication regimen and attend therapy. She states that she works for the [employer] system and has [employer]. She states that she would like resources for outpatient counseling because she has been attending the [facility]. She denies suicidal ideations. She denies homicidal ideations. She denies hearing voices or seeing things that other people cannot see or hear. She does not appear to be responding to internal or external stimuli.  She is agreeable to increasing Prozac to 20 mg po daily.    She gives verbal consent for this provider to contact her cousin Allen Kell a Sayr at [phone], who is her emergency contact person. She states that it is best to contact her cousin because her mother does not understand or speak [place]. This provider contacted Alisa via telephone to discuss safety planning and/or any concerns she may have with the patient discharging. Serina Cowper states that the patient has never attempted suicide in the past and there are no safety concerns with her returning home today. She states that she can pick the patient up today at 3:30 pm when she gets off work. She states that the patient does not have access to weapons in the home.   Musculoskeletal: Strength & Muscle Tone: within normal limits Gait & Station: normal Patient leans: N/A  Psychiatric Specialty Exam  Presentation  General Appearance: Appropriate for Environment  Eye Contact:Fair  Speech:Clear and Coherent  Speech Volume:Normal  Handedness: No data recorded  Mood and Affect  Mood:Euthymic  Duration of Depression Symptoms: Greater than two weeks  Affect:Appropriate   Thought Process  Thought Processes:Coherent; Goal Directed  Descriptions of Associations:Intact  Orientation:Full (Time, Place and Person)  Thought Content:WDL  History of Schizophrenia/Schizoaffective disorder:No  Duration of Psychotic Symptoms:No data recorded Hallucinations:Hallucinations: None  Ideas of Reference:None  Suicidal Thoughts:Suicidal Thoughts: No SI Active Intent and/or Plan: Without Intent; Without Plan  Homicidal Thoughts:Homicidal Thoughts: No   Sensorium  Memory:Immediate Fair; Remote Fair; Recent Fair  Judgment:Fair  Insight:Fair   Executive Functions  Concentration:Fair  Attention Span:Fair  Recall:Fair  [employer] of Knowledge:Fair  Language:Fair   Psychomotor Activity  Psychomotor Activity:Psychomotor Activity:  Normal   Assets  Assets:Communication Skills; Desire for Improvement; Financial Resources/Insurance; Housing; Leisure Time; Social Support; Intimacy; Physical Health; Talents/Skills; Vocational/Educational; Transportation   Sleep  Sleep:Sleep: Fair Number of Hours of Sleep: 8   Physical Exam: Physical Exam HENT:     Head: Normocephalic.     Nose: Nose normal.  Eyes:     Conjunctiva/sclera: Conjunctivae normal.  Cardiovascular:     Rate and Rhythm: Normal rate.  Pulmonary:     Effort: Pulmonary effort is normal.  Musculoskeletal:     Cervical back: Normal range of motion.  Neurological:     Mental Status: She is alert and oriented to person, place, and time.   Review of Systems  Constitutional: Negative.   HENT: Negative.    Gastrointestinal: Negative.   Genitourinary: Negative.   Musculoskeletal: Negative.   Skin: Negative.   Neurological: Negative.   Endo/Heme/Allergies: Negative.   Psychiatric/Behavioral:  Negative for depression, substance abuse and suicidal ideas. The patient is not nervous/anxious.   Blood pressure 106/68, pulse 70, temperature (!) 97.1 F (36.2 C), temperature source Tympanic, resp. rate 18, last menstrual period 03/09/2021, SpO2 99 %. There is no height or weight on file to calculate BMI.  Mental Status Per Nursing Assessment::   On Admission:   SI with no plan or intent, anxiety and depression.   Demographic Factors:  Adolescent or young adult  Loss Factors: NA  Historical Factors: NA  Risk Reduction Factors:   Sense of responsibility to family, Religious beliefs about death, Employed, Living with another person, especially a relative, Positive social support, Positive therapeutic relationship, and Positive coping skills or problem solving skills  Continued Clinical Symptoms:  Panic Attacks  Cognitive Features That Contribute To Risk:  None    Suicide Risk:  Minimal: No identifiable suicidal ideation.  Patients presenting with no  risk factors but with morbid ruminations; may be classified as minimal risk based on the severity of the depressive symptoms    Plan Of Care/Follow-up recommendations:  Activity:  as tolerated   Follow up with outpatient resources provided for psychiatry and counseling.  Take Prozac 20 mg po daily for depression/anxiety  Layla Barter, NP 04/04/2021, 2:21 PM

## 2021-04-04 NOTE — ED Notes (Signed)
Pt sleeping at present, no distress noted.  Monitoring for safety. 

## 2021-04-04 NOTE — ED Notes (Signed)
Pt given breakfast.

## 2021-04-04 NOTE — Progress Notes (Signed)
CSW provided the following resource listed below:  [facility] provide timely access to mental health services for children and adolescents (4-17) and adults presenting in a mental health crisis. The program is designed for those who need urgent Behavioral Health or Substance Use treatment and are not experiencing a medical crisis that would typically require an emergency room visit.    [address] [place], [place] [address] Phone: [phone] [email or web address]   [facility] will also offer the following outpatient services: (Monday through Friday 8am-5pm)   Partial Hospitalization Program (PHP) Substance Abuse Intensive Outpatient Program (SA-IOP) Group Therapy Medication Management Peer Living Room   We also provide (24/7):    Assessments: Our mental health clinician and providers will conduct a focused mental health evaluation, assessing for immediate safety concerns and further mental health needs.   Referral: Our team will provide resources and help connect to community based mental health treatment, when indicated, including psychotherapy, psychiatry, and other specialized behavioral health or substance use disorder services (for those not already in treatment).   Transitional Care: Our team providers in person bridging and/or telphonic follow-up during the patient's transition to outpatient services.    Crissie Reese, MSW, LCSW-A, LCAS-A Phone: [phone] Disposition/TOC

## 2021-04-04 NOTE — ED Provider Notes (Signed)
FBC/OBS ASAP Discharge Summary  Date and : 04/04/2021 1:47 PM  Name: Bethany Cox  MRN:  [id number]   Discharge Diagnoses:  Final diagnoses:  MDD (major depressive disorder), single episode, severe , no psychosis (HCC)  GAD (generalized anxiety disorder)    Subjective: "I feel okay today after I was able to get a lot of sleep last night. I am going to be honest. I came because my counselor told me to get evaluated so that I could get started on medications for my anxiety because my PCP was out of the office. I had a fight with my boyfriend and did not feel safe returning home because of the thoughts. I never had a suicide plan or intended to hurt myself. I live with my mother and brother, but my culture does not understand mental health. I would like to be discharged today because my family has planned to go to [facility] tomorrow to see my aunt who is ill. We will be flying out tomorrow afternoon."  Stay Summary: Patient seen and examined face to face by this provider and chart reviewed. Case discussed with Dr. Jannifer Franklin. On evaluation, patient is alert and oriented x4.  Her thought process is logical and her speech is coherent. Her mood is euthymic and affect is congruent. She has been calm and cooperative while on the unit. She has been medication compliant and denies any side effects to the medications. She has been compliant with attending groups and states that she had a really good group this morning and realizes that she has a lot of people who care about her and the importance of continuing counseling. She has good insight to continue current medication regimen and attend therapy. She states that she works for the [employer] system and has [employer]. She states that she would like resources for outpatient counseling because she has been attending the [facility]. She denies suicidal ideations. She denies homicidal ideations. She denies hearing voices or seeing things that other people  cannot see or hear. She does not appear to be responding to internal or external stimuli. She is agreeable to increasing Prozac to 20 mg po daily.   She gives verbal consent for this provider to contact her cousin Allen Kell a Regulatory affairs officer at [phone], who is her emergency contact person  She states that it is best to contact her cousin because her mother does not understand or speak [place]. This provider contacted Alisa via telephone to discuss safety planning and/or any concerns she may have with the patient discharging. Serina Cowper states that the patient has never attempted suicide in the past and there are no safety concerns with her returning home today. She states that she can pick the patient up today at 3:30 pm when she gets off work. She states that the patient does not have access to weapons in the home.   Total Time spent with patient: 30 minutes  Past Psychiatric History: hx of Anxiety  Past Medical History: No past medical history on file. No past surgical history on file. Family History:  Family History  Problem Relation Age of Onset   Thyroid disease Mother    Family Psychiatric History: patient does not report a family hx of mental illness Social History: None  Social History   Substance and Sexual Activity  Alcohol Use Yes     Social History   Substance and Sexual Activity  Drug Use Never    Social History   Socioeconomic History   Marital status:  Single    Spouse name: Not on file   Number of children: Not on file   Years of education: Not on file   Highest education level: Not on file  Occupational History   Not on file  Tobacco Use   Smoking status: Never   Smokeless tobacco: Never  Vaping Use   Vaping Use: Some days   Substances: Nicotine, Flavoring  Substance and Sexual Activity   Alcohol use: Yes   Drug use: Never   Sexual activity: Not on file  Other Topics Concern   Not on file  Social History Narrative   Not on file   Social Determinants of Health    Financial Resource Strain: Not on file  Food Insecurity: Not on file  Transportation Needs: Not on file  Physical Activity: Not on file  Stress: Not on file  Social Connections: Not on file   SDOH:  SDOH Screenings   Alcohol Screen: Not on file  Depression (PHQ2-9): Not on file  Financial Resource Strain: Not on file  Food Insecurity: Not on file  Housing: Not on file  Physical Activity: Not on file  Social Connections: Not on file  Stress: Not on file  Tobacco Use: Low Risk    Smoking Tobacco Use: Never   Smokeless Tobacco Use: Never  Transportation Needs: Not on file    Tobacco Cessation:  N/A, patient does not currently use tobacco products  Current Medications:  Current Facility-Administered Medications  Medication Dose Route Frequency Provider Last Rate Last Admin   acetaminophen (TYLENOL) tablet 650 mg  650 mg Oral Q6H PRN Jackelyn Poling, NP       alum & mag hydroxide-simeth (MAALOX/MYLANTA) 200-200-20 MG/5ML suspension 30 mL  30 mL Oral Q4H PRN Jackelyn Poling, NP       [START ON 04/05/2021] FLUoxetine (PROZAC) capsule 20 mg  20 mg Oral Daily Merrit Waugh L, NP       hydrOXYzine (ATARAX/VISTARIL) tablet 25 mg  25 mg Oral TID PRN Nira Conn A, NP       magnesium hydroxide (MILK OF MAGNESIA) suspension 30 mL  30 mL Oral Daily PRN Nira Conn A, NP       traZODone (DESYREL) tablet 50 mg  50 mg Oral QHS PRN Nira Conn A, NP   50 mg at 04/03/21 2255   Current Outpatient Medications  Medication Sig Dispense Refill   melatonin 5 MG TABS Take 5 mg by mouth at bedtime as needed.     [START ON 04/05/2021] FLUoxetine (PROZAC) 20 MG capsule Take 1 capsule (20 mg total) by mouth daily. 30 capsule 1   hydrOXYzine (ATARAX/VISTARIL) 25 MG tablet Take 1 tablet (25 mg total) by mouth 3 (three) times daily as needed for anxiety. 30 tablet 0    PTA Medications: (Not in a hospital admission)   Musculoskeletal  Strength & Muscle Tone: within normal limits Gait & Station:  normal Patient leans: N/A  Psychiatric Specialty Exam  Presentation  General Appearance: Appropriate for Environment  Eye Contact:Fair  Speech:Clear and Coherent  Speech Volume:Normal  Handedness: No data recorded  Mood and Affect  Mood:Euthymic  Affect:Appropriate   Thought Process  Thought Processes:Coherent; Goal Directed  Descriptions of Associations:Intact  Orientation:Full (Time, Place and Person)  Thought Content:WDL  Diagnosis of Schizophrenia or Schizoaffective disorder in past: No    Hallucinations:Hallucinations: None  Ideas of Reference:None  Suicidal Thoughts:Suicidal Thoughts: No SI Active Intent and/or Plan: Without Intent; Without Plan  Homicidal Thoughts:Homicidal Thoughts: No  Sensorium  Memory:Immediate Fair; Remote Fair; Recent Fair  Judgment:Fair  Insight:Fair   Executive Functions  Concentration:Fair  Attention Span:Fair  Recall:Fair  [employer] of Knowledge:Fair  Language:Fair   Psychomotor Activity  Psychomotor Activity:Psychomotor Activity: Normal   Assets  Assets:Communication Skills; Desire for Improvement; Financial Resources/Insurance; Housing; Leisure Time; Social Support; Intimacy; Physical Health; Talents/Skills; Vocational/Educational; Transportation   Sleep  Sleep:Sleep: Fair Number of Hours of Sleep: 8   Nutritional Assessment (For OBS and FBC admissions only) Has the patient had a weight loss or gain of 10 pounds or more in the last 3 months?: No Has the patient had a decrease in food intake/or appetite?: No Does the patient have dental problems?: No Does the patient have eating habits or behaviors that may be indicators of an eating disorder including binging or inducing vomiting?: No Has the patient recently lost weight without trying?: No Has the patient been eating poorly because of a decreased appetite?: No Malnutrition Screening Tool Score: 0    Physical Exam  Physical Exam HENT:     Head:  Normocephalic.     Nose: Nose normal.  Eyes:     Conjunctiva/sclera: Conjunctivae normal.  Cardiovascular:     Rate and Rhythm: Normal rate.  Pulmonary:     Effort: Pulmonary effort is normal.  Musculoskeletal:        General: Normal range of motion.     Cervical back: Normal range of motion.  Neurological:     Mental Status: She is alert and oriented to person, place, and time.   Review of Systems  Constitutional: Negative.   HENT: Negative.    Eyes: Negative.   Respiratory: Negative.    Cardiovascular: Negative.   Gastrointestinal: Negative.   Genitourinary: Negative.   Skin: Negative.   Neurological: Negative.   Psychiatric/Behavioral: Negative.    Blood pressure 106/68, pulse 70, temperature (!) 97.1 F (36.2 C), temperature source Tympanic, resp. rate 18, last menstrual period 03/09/2021, SpO2 99 %. There is no height or weight on file to calculate BMI.  Demographic Factors:  Adolescent or young adult  Loss Factors: NA  Historical Factors: NA  Risk Reduction Factors:   Sense of responsibility to family, Religious beliefs about death, Employed, Living with another person, especially a relative, Positive social support, Positive therapeutic relationship, and Positive coping skills or problem solving skills  Continued Clinical Symptoms:  Panic Attacks  Cognitive Features That Contribute To Risk:  None    Suicide Risk:  Minimal: No identifiable suicidal ideation.  Patients presenting with no risk factors but with morbid ruminations; may be classified as minimal risk based on the severity of the depressive symptoms  Plan Of Care/Follow-up recommendations:  Activity:  as tolerated  Prescription for Prozac 20 mg po daily for depression/anxiety and Vistaril 25 mg by mouth as needed 3 times per day for anxiety sent to the patient's pharmacy on file.  Follow up with outpatient resources for psychiatry and counseling.  Safety Plan Bobbye RiggsBeverly Schroader will reach out  to MercedesAlisa,  call 911 or call mobile crisis if condition worsens or if suicidal thoughts become active Patients' will follow up with ECP and resources provided for outpatient psychiatric services (therapy/medication management).  The suicide prevention education provided includes the following: Suicide risk factors Suicide prevention and interventions National Suicide Hotline telephone number [facility] assessment telephone number [facility] Assistance [facility] and/or Residential Mobile Crisis Unit telephone number Request made of family/significant other to: [place]  Remove weapons (e.g., guns, rifles,  knives), all items previously/currently identified as safety concern.   Remove drugs/medications (over the counter, prescriptions, illicit drugs), all items previously/currently identified as a safety concern.    Disposition: Discharge to home.    Makani Seckman L, NP 04/04/2021, 1:47 PM

## 2021-04-07 LAB — GC/CHLAMYDIA PROBE AMP (~~LOC~~) NOT AT ARMC
Chlamydia: NEGATIVE
Comment: NEGATIVE
Comment: NORMAL
Neisseria Gonorrhea: NEGATIVE

## 2021-04-08 ENCOUNTER — Ambulatory Visit: Payer: Self-pay | Admitting: Obstetrics and Gynecology

## 2021-05-21 ENCOUNTER — Ambulatory Visit (INDEPENDENT_AMBULATORY_CARE_PROVIDER_SITE_OTHER): Payer: 59 | Admitting: Obstetrics and Gynecology

## 2021-05-21 ENCOUNTER — Other Ambulatory Visit (HOSPITAL_COMMUNITY)
Admission: RE | Admit: 2021-05-21 | Discharge: 2021-05-21 | Disposition: A | Discharge status: Home / Self Care | Payer: 59 | Source: Ambulatory Visit | Attending: Obstetrics and Gynecology | Admitting: Obstetrics and Gynecology

## 2021-05-21 ENCOUNTER — Other Ambulatory Visit: Payer: Self-pay

## 2021-05-21 ENCOUNTER — Encounter: Payer: Self-pay | Admitting: Obstetrics and Gynecology

## 2021-05-21 VITALS — BP 114/77 | HR 76 | Ht 62.0 in | Wt 122.0 lb

## 2021-05-21 DIAGNOSIS — Z01419 Encounter for gynecological examination (general) (routine) without abnormal findings: Secondary | ICD-10-CM | POA: Insufficient documentation

## 2021-05-21 MED ORDER — XULANE 150-35 MCG/24HR TD PTWK: 1.0000 | MEDICATED_PATCH | TRANSDERMAL | 12 refills | Status: DC

## 2021-05-21 NOTE — Progress Notes (Signed)
Subjective:     Bethany Cox is a 25 y.o. female with BMI 23 and LMP 05/04/21 who is here for a comprehensive physical exam. The patient reports no problems. Patient reports a monthly period lasting 4-5 days. She is sexually active using condoms for contraception. She denies pelvic pain or abnormal discharge.   No past medical history on file.  No past surgical history on file. Family History  Problem Relation Age of Onset   Thyroid disease Mother     Social History   Socioeconomic History   Marital status: Single    Spouse name: Not on file   Number of children: Not on file   Years of education: Not on file   Highest education level: Not on file  Occupational History   Not on file  Tobacco Use   Smoking status: Never   Smokeless tobacco: Never  Vaping Use   Vaping Use: Some days   Substances: Nicotine, Flavoring  Substance and Sexual Activity   Alcohol use: Yes   Drug use: Never   Sexual activity: Not on file  Other Topics Concern   Not on file  Social History Narrative   Not on file   Social Determinants of Health   Financial Resource Strain: Not on file  Food Insecurity: Not on file  Transportation Needs: Not on file  Physical Activity: Not on file  Stress: Not on file  Social Connections: Not on file  Intimate Partner Violence: Not on file   Health Maintenance  Topic Date Due   HPV VACCINES (1 - 2-dose series) Never done   HIV Screening  Never done   Hepatitis C Screening  Never done   TETANUS/TDAP  Never done   PAP-Cervical Cytology Screening  Never done   PAP SMEAR-Modifier  Never done   INFLUENZA VACCINE  Never done   Pneumococcal Vaccine 56-23 Years old  Aged Out       Review of Systems Pertinent items noted in HPI and remainder of comprehensive ROS otherwise negative.   Objective:  Blood pressure 114/77, pulse 76, height 5\' 2"  (1.575 m), weight 122 lb (55.3 kg), last menstrual period 05/04/2021.   GENERAL: Well-developed,  well-nourished female in no acute distress.  HEENT: Normocephalic, atraumatic. Sclerae anicteric.  NECK: Supple. Normal thyroid.  LUNGS: Clear to auscultation bilaterally.  HEART: Regular rate and rhythm. BREASTS: Symmetric in size. No palpable masses or lymphadenopathy, skin changes, or nipple drainage. ABDOMEN: Soft, nontender, nondistended. No organomegaly. PELVIC: Normal external female genitalia. Vagina is pink and rugated.  Normal discharge. Normal appearing cervix. Uterus is normal in size. No adnexal mass or tenderness. Chaperone present during the pelvic exam EXTREMITIES: No cyanosis, clubbing, or edema, 2+ distal pulses.     Assessment:    Healthy female exam.      Plan:    Pap smear collected Patient will be contacted with abnormal results Rx Xulane provided Patient declined STI testing See After Visit Summary for Counseling Recommendations

## 2021-05-21 NOTE — Progress Notes (Signed)
NEW GYN presents for AEX/PAP. She is interested in the Patch for [facility].  Declined STD screening.

## 2021-05-22 LAB — CYTOLOGY - PAP: Diagnosis: NEGATIVE

## 2021-05-28 ENCOUNTER — Other Ambulatory Visit (HOSPITAL_COMMUNITY): Payer: Self-pay

## 2021-05-29 ENCOUNTER — Other Ambulatory Visit (HOSPITAL_COMMUNITY): Payer: Self-pay

## 2021-09-21 ENCOUNTER — Other Ambulatory Visit (HOSPITAL_COMMUNITY): Payer: Self-pay

## 2021-09-21 MED ORDER — CARESTART COVID-19 HOME TEST VI KIT
PACK | 0 refills | Status: DC
  Filled 2021-09-21: qty 4, 4d supply, fill #0

## 2021-10-21 ENCOUNTER — Telehealth: Payer: Self-pay

## 2021-10-21 ENCOUNTER — Telehealth: Payer: Self-pay | Admitting: *Deleted

## 2021-10-21 NOTE — Telephone Encounter (Signed)
Patient left voice mail message in triage. She is not a patient here. I called and provided her with Dr. Mora Bellman office phone number whom she is a patient of. ?

## 2021-10-21 NOTE — Telephone Encounter (Signed)
Patient left a voicemail that was difficult to understand -asking about patch and bleeding and whether to take patch off. ?Per review is [facility] patient , will forward to that office. ?Nancy Fetter ?

## 2021-10-22 ENCOUNTER — Telehealth: Payer: Self-pay

## 2021-10-22 NOTE — Telephone Encounter (Signed)
Returned call and answered questions about breakthrough bleeding with BC ptach ?

## 2021-10-28 ENCOUNTER — Telehealth: Payer: 59 | Admitting: Physician Assistant

## 2021-10-28 ENCOUNTER — Telehealth: Payer: Self-pay | Admitting: Emergency Medicine

## 2021-10-28 DIAGNOSIS — B9689 Other specified bacterial agents as the cause of diseases classified elsewhere: Secondary | ICD-10-CM

## 2021-10-28 DIAGNOSIS — J208 Acute bronchitis due to other specified organisms: Secondary | ICD-10-CM

## 2021-10-28 MED ORDER — ALBUTEROL SULFATE HFA 108 (90 BASE) MCG/ACT IN AERS: 2.0000 | INHALATION_SPRAY | Freq: Four times a day (QID) | RESPIRATORY_TRACT | 0 refills | Status: DC | PRN

## 2021-10-28 MED ORDER — BENZONATATE 100 MG PO CAPS: 100.0000 mg | ORAL_CAPSULE | Freq: Three times a day (TID) | ORAL | 0 refills | Status: DC | PRN

## 2021-10-28 MED ORDER — AZITHROMYCIN 250 MG PO TABS: ORAL_TABLET | ORAL | 0 refills | Status: AC

## 2021-10-28 NOTE — Progress Notes (Signed)
We are sorry that you are not feeling well.  Here is how we plan to help! ? ?Based on your presentation I believe you most likely have A cough due to bacteria.  When patients have a fever and a productive cough with a change in color or increased sputum production, we are concerned about bacterial bronchitis.  If left untreated it can progress to pneumonia.  If your symptoms do not improve with your treatment plan it is important that you contact your provider.   I have prescribed Azithromyin 250 mg: two tablets now and then one tablet daily for 4 additonal days  ?  ?In addition you may use A non-prescription cough medication called Mucinex DM: take 2 tablets every 12 hours. and A prescription cough medication called Tessalon Perles 100mg . You may take 1-2 capsules every 8 hours as needed for your cough. ? ?Albuterol inhaler 1-2 puffs every 4-6 hours as needed for wheezing and shortness of breath. ? ?From your responses in the eVisit questionnaire you describe inflammation in the upper respiratory tract which is causing a significant cough.  This is commonly called Bronchitis and has four common causes:   ?Allergies ?Viral Infections ?Acid Reflux ?Bacterial Infection ?Allergies, viruses and acid reflux are treated by controlling symptoms or eliminating the cause. An example might be a cough caused by taking certain blood pressure medications. You stop the cough by changing the medication. Another example might be a cough caused by acid reflux. Controlling the reflux helps control the cough. ? ?USE OF BRONCHODILATOR ("RESCUE") INHALERS: ?There is a risk from using your bronchodilator too frequently.  The risk is that over-reliance on a medication which only relaxes the muscles surrounding the breathing tubes can reduce the effectiveness of medications prescribed to reduce swelling and congestion of the tubes themselves.  Although you feel brief relief from the bronchodilator inhaler, your asthma may actually be  worsening with the tubes becoming more swollen and filled with mucus.  This can delay other crucial treatments, such as oral steroid medications. If you need to use a bronchodilator inhaler daily, several times per day, you should discuss this with your provider.  There are probably better treatments that could be used to keep your asthma under control.  ?   ?HOME CARE ?Only take medications as instructed by your medical team. ?Complete the entire course of an antibiotic. ?Drink plenty of fluids and get plenty of rest. ?Avoid close contacts especially the very young and the elderly ?Cover your mouth if you cough or cough into your sleeve. ?Always remember to wash your hands ?A steam or ultrasonic humidifier can help congestion.  ? ?GET HELP RIGHT AWAY IF: ?You develop worsening fever. ?You become short of breath ?You cough up blood. ?Your symptoms persist after you have completed your treatment plan ?MAKE SURE YOU  ?Understand these instructions. ?Will watch your condition. ?Will get help right away if you are not doing well or get worse. ?  ? ?Thank you for choosing an e-visit. ? ?Your e-visit answers were reviewed by a board certified advanced clinical practitioner to complete your personal care plan. Depending upon the condition, your plan could have included both over the counter or prescription medications. ? ?Please review your pharmacy choice. Make sure the pharmacy is open so you can pick up prescription now. If there is a problem, you may contact your provider through [employer] and have the prescription routed to another pharmacy.  Your safety is important to [place]. If you have drug  allergies check your prescription carefully.  ? ?For the next 24 hours you can use MyChart to ask questions about today's visit, request a non-urgent call back, or ask for a work or school excuse. ?You will get an email in the next two days asking about your experience. I hope that your e-visit has been valuable and will  speed your recovery. ? ?I provided 5 minutes of non face-to-face time during this encounter for chart review and documentation.  ? ?

## 2021-10-28 NOTE — Telephone Encounter (Signed)
Return call to patient regarding break through bleeding with patch. Advised to call to schedule appointment for virtual visit to discuss symptoms. ?Patient verbalized understanding. ? ? ?

## 2021-11-12 ENCOUNTER — Ambulatory Visit (INDEPENDENT_AMBULATORY_CARE_PROVIDER_SITE_OTHER): Payer: 59

## 2021-11-12 VITALS — BP 133/95 | Ht 62.0 in | Wt 126.0 lb

## 2021-11-12 DIAGNOSIS — N939 Abnormal uterine and vaginal bleeding, unspecified: Secondary | ICD-10-CM

## 2021-11-12 NOTE — Progress Notes (Signed)
Pt states she was trying to skip her period. She has not took her patch off since January, she reports heavy bleeding with clots. ?

## 2021-11-12 NOTE — Progress Notes (Signed)
? ?  GYNECOLOGY PROGRESS NOTE ? ?History:  ?26 y.o. G0P0000 presents to [place] office today for problem gyn visit. She reports she was trying to skip her period so she has been wearing her birth control patch consistently since January, although has changed it weekly. Since March 21st, she has been bleeding consistently every day. Reports last week she had "very heavy bleeding" where she had to change her pad every 2 hours and was passing large clots. Bleeding is minimal today. Has been very dizzy but is unsure if this is related to her anxiety, the bleeding, being stressed out, etc. She denies pain. She is concerned that she had a miscarriage although she did not take a pregnancy test at home and has not missed a patch. She wishes to discontinue the patch as she is not happy with hormonal birth control and only started taking it for her acne and not to prevent pregnancy ? ?The following portions of the patient's history were reviewed and updated as appropriate: allergies, current medications, past family history, past medical history, past social history, past surgical history and problem list. Last pap smear on 05/21/2021 was NILM.  ? ?Health Maintenance Due  ?Topic Date Due  ? HIV Screening  Never done  ? Hepatitis C Screening  Never done  ? TETANUS/TDAP  Never done  ?  ? ?Review of Systems:  ?Pertinent items are noted in HPI. ?  ?Objective:  ?Physical Exam ?Blood pressure (!) 133/95, height 5\' 2"  (1.575 m), weight 126 lb (57.2 kg), last menstrual period 10/20/2021. ?VS reviewed, nursing note reviewed,  ?Constitutional: well developed, well nourished, no distress ?HEENT: normocephalic ?CV: normal rate ?Pulm/chest wall: normal effort ?Breast Exam: deferred ?Abdomen: soft ?Neuro: alert and oriented x 3 ?Skin: warm, dry ?Psych: affect normal ?Pelvic exam: deferred ? ?Assessment & Plan:  ?1. Vaginal bleeding ?- I reviewed that breakthrough bleeding is common with the birth control patch. I also reviewed that  bleeding could be related to wearing patch consistently.  ?- I discussed options of removing patch and giving body 1 week to adjust and then placing new patch after 1 week vs discontinuing the patch all together and using another form on contraception.  ?- Patient is tearful and visibly upset reporting that she no longer wishes to be on the patch. I reviewed other forms of contraception, focusing primarily on non-hormonal options as she verbalizes she does not want hormones. She declines. She will use condoms. She verbalizes that she would not be upset if she were to conceive. ?- Patient is asking if she can remove patch today in which I verbalize that she is free to make her own contraceptive choices and if she no longer desires to be on it she may remove it whenever she desires.  ?- She is requesting a pregnancy test, but is unable to leave a urine sample as she used the bathroom prior to coming in and did not leave a sample. bHCG ordered. Will also order CBC given patient reports dizziness. ? ?- CBC ?- Beta hCG quant (ref lab) ? ? ?No follow-ups on file.  ? ? ? ?Renee Harder, CNM ?11/12/21 ?4:53 PM ? ?

## 2021-11-13 LAB — CBC
Hematocrit: 36.9 % (ref 34.0–46.6)
Hemoglobin: 11.9 g/dL (ref 11.1–15.9)
MCH: 24.5 pg — ABNORMAL LOW (ref 26.6–33.0)
MCHC: 32.2 g/dL (ref 31.5–35.7)
MCV: 76 fL — ABNORMAL LOW (ref 79–97)
Platelets: 349 10*3/uL (ref 150–450)
RBC: 4.85 x10E6/uL (ref 3.77–5.28)
RDW: 14.4 % (ref 11.7–15.4)
WBC: 6.6 10*3/uL (ref 3.4–10.8)

## 2021-11-13 LAB — BETA HCG QUANT (REF LAB): hCG Quant: <1 m[IU]/mL

## 2021-11-17 NOTE — Progress Notes (Signed)
? ?New Patient Office Visit ? ?Subjective   ? ?Patient ID: Bethany Cox, female    DOB: 1996-01-11  Age: 26 y.o. MRN: [id number] ? ?CC:  ?Chief Complaint  ?Patient presents with  ? Establish Care  ?  Np. Est care. No main concerns. Pt is fasting. C/o persistant cough from previous infection.   ? ? ?HPI ?Bethany Cox presents for new patient visit to establish care.  Introduced to [employer] role and practice setting.  All questions answered.  Discussed provider/patient relationship and expectations. ? ?She is still having an ongoing cough since being sick in February. She saw a virtual provider on 10/28/21 and was prescribed a z-pak and mucinex. She feels like she has drainage in the back of her throat.  The cough and congestion is worse in the morning when she wakes up.  She denies nasal congestion, sore throat, ear pain. ? ?She has a history of headaches that come and go. She states that they tend to occur if she doesn't get enough sleep or when she is stressed at work. She takes excedrin as needed which helps. She describes the pain as aching and tight along her forehead and the back of her neck. Denies sensitivity to light and sound, nausea and vomiting.  She currently denies headache. ? ?She has a history of anxiety and depression. She states that she gets angry quickly. She was taking fluoxetine daily which was helping her symptoms.  She would like to restart her fluoxetine 20 mg daily.  She denies SI/HI. ? ? ?  11/19/2021  ?  9:51 AM 05/21/2021  ? 10:21 AM  ?Depression screen PHQ 2/9  ?Decreased Interest 2 2  ?Down, Depressed, Hopeless 2 3  ?PHQ - 2 Score 4 5  ?Altered sleeping 2 0  ?Tired, decreased energy 3 3  ?Change in appetite 1 1  ?Feeling bad or failure about yourself  3 3  ?Trouble concentrating 0 1  ?Moving slowly or fidgety/restless 0 2  ?Suicidal thoughts 0 1  ?PHQ-9 Score 13 16  ?Difficult doing work/chores Somewhat difficult Not difficult at all  ? ? ?  11/19/2021  ?  9:52 AM   ?GAD 7 : Generalized Anxiety Score  ?Nervous, Anxious, on Edge 0  ?Control/stop worrying 2  ?Worry too much - different things 2  ?Trouble relaxing 3  ?Restless 0  ?Easily annoyed or irritable 3  ?Afraid - awful might happen 0  ?Total GAD 7 Score 10  ?Anxiety Difficulty Somewhat difficult  ? ? ?Outpatient Encounter Medications as of 11/19/2021  ?Medication Sig  ? benzonatate (TESSALON) 100 MG capsule Take 1 capsule (100 mg total) by mouth 3 (three) times daily as needed.  ? FLUoxetine (PROZAC) 20 MG capsule Take 1 capsule (20 mg total) by mouth daily.  ? melatonin 5 MG TABS Take 5 mg by mouth at bedtime as needed. (Patient not taking: Reported on 11/12/2021)  ? [DISCONTINUED] albuterol (VENTOLIN HFA) 108 (90 Base) MCG/ACT inhaler Inhale 2 puffs into the lungs every 6 (six) hours as needed for wheezing or shortness of breath. (Patient not taking: Reported on 11/12/2021)  ? [DISCONTINUED] COVID-19 At Home Antigen Test [facility] COVID-19 HOME TEST) KIT Use as directed (Patient not taking: Reported on 11/12/2021)  ? [DISCONTINUED] FLUoxetine (PROZAC) 20 MG capsule Take 1 capsule (20 mg total) by mouth daily. (Patient not taking: Reported on 11/12/2021)  ? [DISCONTINUED] hydrOXYzine (ATARAX/VISTARIL) 25 MG tablet Take 1 tablet (25 mg total) by mouth 3 (three) times daily as needed  for anxiety. (Patient not taking: Reported on 11/12/2021)  ? [DISCONTINUED] norelgestromin-ethinyl estradiol Marilu Favre) 150-35 MCG/24HR transdermal patch Place 1 patch onto the skin once a week.  ? ?No facility-administered encounter medications on file as of 11/19/2021.  ? ? ?Past Medical History:  ?Diagnosis Date  ? Anemia   ? Anxiety   ? COVID-19   ? Depression   ? Headache   ? ? ?Past Surgical History:  ?Procedure Laterality Date  ? NO PAST SURGERIES    ? ? ?Family History  ?Problem Relation Age of Onset  ? Thyroid disease Mother   ? Drug abuse Father   ? Hypertension Father   ? Hypertension Son   ? Hypertension Maternal Grandmother   ? Diabetes  Paternal Grandmother   ? Hypertension Paternal Grandmother   ? Diabetes Paternal Grandfather   ? Cancer Paternal Grandfather   ?     stomach  ? ? ?Social History  ? ?Socioeconomic History  ? Marital status: Significant Other  ?  Spouse name: Not on file  ? Number of children: 0  ? Years of education: Not on file  ? Highest education level: Not on file  ?Occupational History  ? Not on file  ?Tobacco Use  ? Smoking status: Never  ? Smokeless tobacco: Never  ?Vaping Use  ? Vaping Use: Never used  ?Substance and Sexual Activity  ? Alcohol use: Yes  ?  Comment: 1-2x per month  ? Drug use: Never  ? Sexual activity: Yes  ?Other Topics Concern  ? Not on file  ?Social History Narrative  ? Not on file  ? ?Social Determinants of Health  ? ?Financial Resource Strain: Not on file  ?Food Insecurity: Not on file  ?Transportation Needs: Not on file  ?Physical Activity: Not on file  ?Stress: Not on file  ?Social Connections: Not on file  ?Intimate Partner Violence: Not on file  ? ? ?Review of Systems  ?Constitutional:  Positive for malaise/fatigue.  ?HENT:  Negative for ear pain and sore throat.   ?     Post nasal drip  ?Eyes: Negative.   ?Respiratory:  Positive for cough. Negative for shortness of breath and wheezing.   ?Cardiovascular: Negative.   ?Gastrointestinal: Negative.   ?Genitourinary: Negative.   ?Musculoskeletal:  Positive for back pain (low back pain).  ?Skin: Negative.   ?Neurological:  Positive for headaches. Negative for dizziness.  ?Psychiatric/Behavioral:  Positive for depression. The patient is nervous/anxious.   ? ?  ?Objective   ? ?BP 122/84 (BP Location: Left Arm, Patient Position: Sitting, Cuff Size: Normal)   Pulse 81   Temp (!) 97.4 ?F (36.3 ?C) (Temporal)   Ht _0  (1.575 m)   Wt 127 lb (57.6 kg)   LMP 10/20/2021   SpO2 97%   BMI 23.23 kg/m?  ? ?Physical Exam ?Vitals and nursing note reviewed.  ?Constitutional:   ?   General: She is not in acute distress. ?   Appearance: Normal appearance.  ?HENT:   ?   Head: Normocephalic and atraumatic.  ?   Right Ear: Tympanic membrane, ear canal and external ear normal.  ?   Left Ear: Tympanic membrane, ear canal and external ear normal.  ?   Nose: Nose normal.  ?   Mouth/Throat:  ?   Mouth: Mucous membranes are moist.  ?   Pharynx: Oropharynx is clear.  ?Eyes:  ?   Conjunctiva/sclera: Conjunctivae normal.  ?Cardiovascular:  ?   Rate and Rhythm: Normal rate and regular  rhythm.  ?   Pulses: Normal pulses.  ?   Heart sounds: Normal heart sounds.  ?Pulmonary:  ?   Effort: Pulmonary effort is normal.  ?   Breath sounds: Normal breath sounds.  ?Abdominal:  ?   General: Bowel sounds are normal.  ?   Palpations: Abdomen is soft.  ?   Tenderness: There is no abdominal tenderness.  ?Musculoskeletal:     ?   General: Normal range of motion.  ?   Cervical back: Normal range of motion. No tenderness.  ?Lymphadenopathy:  ?   Cervical: No cervical adenopathy.  ?Skin: ?   General: Skin is warm and dry.  ?Neurological:  ?   General: No focal deficit present.  ?   Mental Status: She is alert and oriented to person, place, and time.  ?Psychiatric:     ?   Mood and Affect: Mood normal.     ?   Behavior: Behavior normal.     ?   Thought Content: Thought content normal.     ?   Judgment: Judgment normal.  ? ? ?Last CBC ?Lab Results  ?Component Value Date  ? WBC 6.6 11/12/2021  ? HGB 11.9 11/12/2021  ? HCT 36.9 11/12/2021  ? MCV 76 (L) 11/12/2021  ? MCH 24.5 (L) 11/12/2021  ? RDW 14.4 11/12/2021  ? PLT 349 11/12/2021  ? ?Last metabolic panel ?Lab Results  ?Component Value Date  ? GLUCOSE 100 (H) 04/03/2021  ? NA 137 04/03/2021  ? K 3.6 04/03/2021  ? CL 106 04/03/2021  ? CO2 23 04/03/2021  ? BUN 10 04/03/2021  ? CREATININE 0.63 04/03/2021  ? GFRNONAA >60 04/03/2021  ? CALCIUM 8.7 (L) 04/03/2021  ? ANIONGAP 8 04/03/2021  ? ?Last lipids ?Lab Results  ?Component Value Date  ? CHOL 216 (H) 04/03/2021  ? HDL 79 04/03/2021  ? LDLCALC 131 (H) 04/03/2021  ? TRIG 32 04/03/2021  ? CHOLHDL 2.7 04/03/2021   ? ?  ? ?Assessment & Plan:  ? ?Problem List Items Addressed This Visit   ? ?  ? Other  ? Tension headache  ?  Chronic, ongoing for several months.  She states that her headaches are worse at work and if she does

## 2021-11-19 ENCOUNTER — Encounter: Payer: Self-pay | Admitting: Nurse Practitioner

## 2021-11-19 ENCOUNTER — Ambulatory Visit: Payer: 59 | Admitting: Nurse Practitioner

## 2021-11-19 VITALS — BP 122/84 | HR 81 | Temp 97.4°F | Ht 62.0 in | Wt 127.0 lb

## 2021-11-19 DIAGNOSIS — R052 Subacute cough: Secondary | ICD-10-CM

## 2021-11-19 DIAGNOSIS — E7800 Pure hypercholesterolemia, unspecified: Secondary | ICD-10-CM | POA: Diagnosis not present

## 2021-11-19 DIAGNOSIS — F321 Major depressive disorder, single episode, moderate: Secondary | ICD-10-CM | POA: Diagnosis not present

## 2021-11-19 DIAGNOSIS — G44209 Tension-type headache, unspecified, not intractable: Secondary | ICD-10-CM

## 2021-11-19 DIAGNOSIS — F419 Anxiety disorder, unspecified: Secondary | ICD-10-CM | POA: Diagnosis not present

## 2021-11-19 MED ORDER — FLUOXETINE HCL 20 MG PO CAPS: 20.0000 mg | ORAL_CAPSULE | Freq: Every day | ORAL | 1 refills | Status: DC

## 2021-11-19 NOTE — Assessment & Plan Note (Addendum)
Chronic, not controlled.  She states that she has been out of her medications for a while.  She was taking fluoxetine 20 mg daily which was helping with her symptoms.  We will restart this today, sent to the pharmacy.  She currently denies SI/HI.  She does also endorse anxiety with worrying about various things every day.  Lifestyle changes for managing anxiety given as well.  Her PHQ-9 is a 13 and her GAD-7 is a 10.  Follow-up in 2-3 months. ?

## 2021-11-19 NOTE — Assessment & Plan Note (Signed)
Last labs on 04/03/2021 showed a total cholesterol 216 and LDL at 131.  We will continue to monitor these labs routinely.  ?

## 2021-11-19 NOTE — Assessment & Plan Note (Addendum)
Chronic, not controlled.  She states that she has been out of her medications for a while.  She was taking fluoxetine 20 mg daily which was helping with her symptoms.  We will restart this today, sent to the pharmacy.  She currently denies SI/HI.  She does also endorse anxiety with worrying about various things every day.  Her PHQ-9 is a 13 and her GAD-7 is a 10.  Follow-up in 2-3 months. ?

## 2021-11-19 NOTE — Assessment & Plan Note (Signed)
She has been having an ongoing cough since her recent illness in February.  She states that overall it is getting better, however she does wake up with congestion in the back of her throat in the mornings.  We will have her start Flonase nasal spray daily.  She can also continue the Tessalon as needed for cough.  Follow-up if her symptoms worsen or do not improve. ?

## 2021-11-19 NOTE — Assessment & Plan Note (Signed)
Chronic, ongoing for several months.  She states that her headaches are worse at work and if she does not get enough sleep.  She denies sensitivity to light and sound, nausea and vomiting.  The pain is typically across her forehead and behind her neck.  These are most consistent with tension headaches.  She continue Excedrin as needed for headache.  Discussed limiting stress and anxiety, making sure that she is exercising, meditating.  We are restarting her fluoxetine as mentioned below for her anxiety and depression.  This may help with her headaches as well.  Follow-up in 4 to 6 weeks. ?

## 2021-11-19 NOTE — Patient Instructions (Signed)
It was great to see you! ? ?Start flonase nasal spray daily.  ? ?Let's follow-up in 3 months, sooner if you have concerns. ? ?If a referral was placed today, you will be contacted for an appointment. Please note that routine referrals can sometimes take up to 3-4 weeks to process. Please call our office if you haven't heard anything after this time frame. ? ?Take care, ? ?Vance Peper, NP ? ?

## 2021-11-19 NOTE — Assessment & Plan Note (Deleted)
Chronic, not controlled.  She states that she has been out of her medications for a while.  She was taking fluoxetine 20 mg daily which was helping with her symptoms.  We will restart this today, sent to the pharmacy.  She currently denies SI/HI.  She does also endorse anxiety with worrying about various things every day.  Her PHQ-9 is a 13 and her GAD-7 is a 10.  Follow-up in 4 to 6 weeks. ?

## 2021-12-26 DIAGNOSIS — H52223 Regular astigmatism, bilateral: Secondary | ICD-10-CM | POA: Diagnosis not present

## 2022-01-01 ENCOUNTER — Ambulatory Visit: Payer: 59 | Admitting: Nurse Practitioner

## 2022-02-18 NOTE — Progress Notes (Deleted)
There were no vitals taken for this visit.   Subjective:    Patient ID: Bethany Cox, female    DOB: [birth year], 26 y.o.   MRN: [id number]  CC: No chief complaint on file.  HPI: Bethany Cox is a 26 y.o. female presenting on 02/19/2022 for comprehensive medical examination. Current medical complaints include:{Blank single:19197::"none","***"}  She currently lives with: Menopausal Symptoms: {Blank single:19197::"yes","no"}  Depression Screen done today and results listed below:     11/19/2021    9:51 AM 05/21/2021   10:21 AM  Depression screen PHQ 2/9  Decreased Interest 2 2  Down, Depressed, Hopeless 2 3  PHQ - 2 Score 4 5  Altered sleeping 2 0  Tired, decreased energy 3 3  Change in appetite 1 1  Feeling bad or failure about yourself  3 3  Trouble concentrating 0 1  Moving slowly or fidgety/restless 0 2  Suicidal thoughts 0 1  PHQ-9 Score 13 16  Difficult doing work/chores Somewhat difficult Not difficult at all    The patient {has/does not have:19849} a history of falls. I {did/did not:19850} complete a risk assessment for falls. A plan of care for falls {was/was not:19852} documented.   Past Medical History:  Past Medical History:  Diagnosis Date   Anemia    Anxiety    COVID-19    Depression    Headache     Surgical History:  Past Surgical History:  Procedure Laterality Date   NO PAST SURGERIES      Medications:  Current Outpatient Medications on File Prior to Visit  Medication Sig   benzonatate (TESSALON) 100 MG capsule Take 1 capsule (100 mg total) by mouth 3 (three) times daily as needed.   FLUoxetine (PROZAC) 20 MG capsule Take 1 capsule (20 mg total) by mouth daily.   melatonin 5 MG TABS Take 5 mg by mouth at bedtime as needed. (Patient not taking: Reported on 11/12/2021)   No current facility-administered medications on file prior to visit.    Allergies:  Allergies  Allergen Reactions   Shellfish Allergy Itching and Swelling     Social History:  Social History   Socioeconomic History   Marital status: Significant Other    Spouse name: Not on file   Number of children: 0   Years of education: Not on file   Highest education level: Not on file  Occupational History   Not on file  Tobacco Use   Smoking status: Never   Smokeless tobacco: Never  Vaping Use   Vaping Use: Never used  Substance and Sexual Activity   Alcohol use: Yes    Comment: 1-2x per month   Drug use: Never   Sexual activity: Yes  Other Topics Concern   Not on file  Social History Narrative   Not on file   Social Determinants of Health   Financial Resource Strain: Not on file  Food Insecurity: Not on file  Transportation Needs: Not on file  Physical Activity: Not on file  Stress: Not on file  Social Connections: Not on file  Intimate Partner Violence: Not on file   Social History   Tobacco Use  Smoking Status Never  Smokeless Tobacco Never   Social History   Substance and Sexual Activity  Alcohol Use Yes   Comment: 1-2x per month    Family History:  Family History  Problem Relation Age of Onset   Thyroid disease Mother    Drug abuse Father    Hypertension Father  Hypertension Son    Hypertension Maternal Grandmother    Diabetes Paternal Grandmother    Hypertension Paternal Grandmother    Diabetes Paternal Grandfather    Cancer Paternal Grandfather        stomach    Past medical history, surgical history, medications, allergies, family history and social history reviewed with patient today and changes made to appropriate areas of the chart.   ROS All other ROS negative except what is listed above and in the HPI.      Objective:    There were no vitals taken for this visit.  Wt Readings from Last 3 Encounters:  11/19/21 127 lb (57.6 kg)  11/12/21 126 lb (57.2 kg)  05/21/21 122 lb (55.3 kg)    Physical Exam  Results for orders placed or performed in visit on 11/12/21  CBC  Result Value Ref  Range   WBC 6.6 3.4 - 10.8 x10E3/uL   RBC 4.85 3.77 - 5.28 x10E6/uL   Hemoglobin 11.9 11.1 - 15.9 g/dL   Hematocrit 54.6 56.8 - 46.6 %   MCV 76 (L) 79 - 97 fL   MCH 24.5 (L) 26.6 - 33.0 pg   MCHC 32.2 31.5 - 35.7 g/dL   RDW 12.7 51.7 - 00.1 %   Platelets 349 150 - 450 x10E3/uL  Beta hCG quant (ref lab)  Result Value Ref Range   hCG Quant <1 mIU/mL      Assessment & Plan:   Problem List Items Addressed This Visit   None    Follow up plan: No follow-ups on file.   LABORATORY TESTING:  - Pap smear: up to date  IMMUNIZATIONS:   - Tdap: Tetanus vaccination status reviewed: last tetanus booster within 10 years. - Influenza: Postponed to flu season - Pneumovax: Not applicable - Prevnar: Not applicable - HPV: Not applicable - Zostavax vaccine: Not applicable  SCREENING: -Mammogram: Not applicable  - Colonoscopy: Not applicable  - Bone Density: Not applicable  -Hearing Test: Not applicable  -Spirometry: Not applicable   PATIENT COUNSELING:   Advised to take 1 mg of folate supplement per day if capable of pregnancy.   Sexuality: Discussed sexually transmitted diseases, partner selection, use of condoms, avoidance of unintended pregnancy  and contraceptive alternatives.   Advised to avoid cigarette smoking.  I discussed with the patient that most people either abstain from alcohol or drink within safe limits (<=14/week and <=4 drinks/occasion for males, <=7/weeks and <= 3 drinks/occasion for females) and that the risk for alcohol disorders and other health effects rises proportionally with the number of drinks per week and how often a drinker exceeds daily limits.  Discussed cessation/primary prevention of drug use and availability of treatment for abuse.   Diet: Encouraged to adjust caloric intake to maintain  or achieve ideal body weight, to reduce intake of dietary saturated fat and total fat, to limit sodium intake by avoiding high sodium foods and not adding table  salt, and to maintain adequate dietary potassium and calcium preferably from fresh fruits, vegetables, and low-fat dairy products.    stressed the importance of regular exercise  Injury prevention: Discussed safety belts, safety helmets, smoke detector, smoking near bedding or upholstery.   Dental health: Discussed importance of regular tooth brushing, flossing, and dental visits.    NEXT PREVENTATIVE PHYSICAL DUE IN 1 YEAR. No follow-ups on file.

## 2022-02-19 ENCOUNTER — Encounter: Payer: 59 | Admitting: Nurse Practitioner

## 2022-02-19 ENCOUNTER — Telehealth: Payer: Self-pay | Admitting: Nurse Practitioner

## 2022-02-19 NOTE — Telephone Encounter (Signed)
Pt was a no show 02/19/2022 for a physical with Rodman Pickle, I have sent out a letter

## 2022-03-05 NOTE — Telephone Encounter (Signed)
1st no show, fee waived, letter sent 

## 2022-11-16 ENCOUNTER — Other Ambulatory Visit (HOSPITAL_COMMUNITY)
Admission: RE | Admit: 2022-11-16 | Discharge: 2022-11-16 | Disposition: A | Discharge status: Home / Self Care | Payer: 59 | Source: Ambulatory Visit | Attending: Obstetrics and Gynecology | Admitting: Obstetrics and Gynecology

## 2022-11-16 ENCOUNTER — Encounter: Payer: Self-pay | Admitting: Obstetrics and Gynecology

## 2022-11-16 ENCOUNTER — Ambulatory Visit (INDEPENDENT_AMBULATORY_CARE_PROVIDER_SITE_OTHER): Payer: 59 | Admitting: Obstetrics and Gynecology

## 2022-11-16 VITALS — BP 127/83 | HR 79 | Ht 62.0 in | Wt 130.0 lb

## 2022-11-16 DIAGNOSIS — Z113 Encounter for screening for infections with a predominantly sexual mode of transmission: Secondary | ICD-10-CM

## 2022-11-16 DIAGNOSIS — Z01419 Encounter for gynecological examination (general) (routine) without abnormal findings: Secondary | ICD-10-CM

## 2022-11-16 LAB — POCT URINE PREGNANCY: Preg Test, Ur: NEGATIVE

## 2022-11-16 NOTE — Progress Notes (Signed)
ANNUAL EXAM Patient name: Bethany Cox MRN [id number]  Date of birth: 01-Mar-1996 Chief Complaint:   Gynecologic Exam  History of Present Illness:   Bethany Cox is a 27 y.o. G0P0000 with Patient's last menstrual period was 10/25/2022 (exact date). being seen today for a routine annual exam.  Current complaints:  Recently took Plan B. Had period a week early, then a couple weeks later had spotting. UPT negative today  Regular periods w/ no issues   Upstream - 11/16/22 1607       Pregnancy Intention Screening   Does the patient want to become pregnant in the next year? No    Does the patient's partner want to become pregnant in the next year? No    Would the patient like to discuss contraceptive options today? No      Contraception Wrap Up   Current Method Female Condom    End Method Female Condom    Contraception Counseling Provided No    How was the end contraceptive method provided? N/A            The pregnancy intention screening data noted above was reviewed. Potential methods of contraception were discussed. The patient elected to proceed with Female Condom.   Last pap NILM 05/2021. No prior abnormals Last mammogram: n/a no FHX Last colonoscopy: n/a no FHX HPV vaccine: unsure     11/16/2022    4:03 PM 11/19/2021    9:51 AM 05/21/2021   10:21 AM  Depression screen PHQ 2/9  Decreased Interest Down, Depressed, Hopeless PHQ - 2 Score Altered sleeping 3 2 0  Tired, decreased energy Change in appetite 0 1 1  Feeling bad or failure about yourself  Trouble concentrating 1 0 1  Moving slowly or fidgety/restless 0 0 2  Suicidal thoughts 0 0 1  PHQ-9 Score Difficult doing work/chores Not difficult at all Somewhat difficult Not difficult at all      11/19/2021    9:52 AM  GAD 7 : Generalized Anxiety Score  Nervous, Anxious, on Edge 0  Control/stop worrying 2  Worry too much - different things 2  Trouble  relaxing 3  Restless 0  Easily annoyed or irritable 3  Afraid - awful might happen 0  Total GAD 7 Score 10  Anxiety Difficulty Somewhat difficult   Review of Systems:   Pertinent items are noted in HPI Denies any headaches, blurred vision, fatigue, shortness of breath, chest pain, abdominal pain, abnormal vaginal discharge/itching/odor/irritation, problems with periods, bowel movements, urination, or intercourse unless otherwise stated above. Pertinent History Reviewed:  Reviewed past medical,surgical, social and family history.  Reviewed problem list, medications and allergies. Physical Assessment:   Vitals:   11/16/22 1556  BP: 127/83  Pulse: 79  Weight: 130 lb (59 kg)  Height:  (1.575 m)  Body mass index is 23.78 kg/m.        Physical Examination:   General appearance - well appearing, and in no distress  Mental status - alert, oriented to person, place, and time  Chest - respiratory effort normal  Heart - normal peripheral perfusion  Breasts - breasts appear normal, no suspicious masses, no skin or nipple changes or axillary nodes  Abdomen - soft, nontender, nondistended, no masses or organomegaly  Pelvic - VULVA: normal appearing vulva with no masses, tenderness or lesions  Deferred BME/speculum exam after  discussion with patient  Chaperone present for exam  Results for orders placed or performed in visit on 11/16/22 (from the past 24 hour(s))  POCT urine pregnancy   Collection Time: 11/16/22  4:40 PM  Result Value Ref Range   Preg Test, Ur Negative Negative    Assessment & Plan:  1) Well-Woman Exam Mammogram: @ 28yo, or sooner if problems Colonoscopy: @ 28yo, or sooner if problems Pap: due 2025 Gardasil: pt will d/w her family  GC/CT: collected HIV/HCV: collected Condoms for contraception. Interested in conceiving in next 1-2 years. Discussed PNV  2) Positive depression Screening Following with therapist & PCP  Labs/procedures today:  Orders Placed  This Encounter  Procedures   HIV antibody (with reflex)   Hepatitis C Antibody   Hepatitis B Surface AntiGEN   RPR   POCT urine pregnancy   Meds: No orders of the defined types were placed in this encounter.  Follow-up: Return in about 1 year (around 11/16/2023) for annual exam with pap.  Lennart Pall, MD 11/16/2022 5:13 PM

## 2022-11-16 NOTE — Progress Notes (Signed)
27 y.o. GYN presents for AEX/STD screening.

## 2022-11-17 LAB — CERVICOVAGINAL ANCILLARY ONLY
Chlamydia: NEGATIVE
Comment: NEGATIVE
Comment: NEGATIVE
Comment: NORMAL
Neisseria Gonorrhea: NEGATIVE
Trichomonas: NEGATIVE

## 2022-11-17 LAB — HEPATITIS B SURFACE ANTIGEN: Hepatitis B Surface Ag: NEGATIVE

## 2022-11-17 LAB — RPR: RPR Ser Ql: NONREACTIVE

## 2022-11-17 LAB — HEPATITIS C ANTIBODY: Hep C Virus Ab: NONREACTIVE

## 2022-11-17 LAB — HIV ANTIBODY (ROUTINE TESTING W REFLEX): HIV Screen 4th Generation wRfx: NONREACTIVE

## 2022-12-28 DIAGNOSIS — H52223 Regular astigmatism, bilateral: Secondary | ICD-10-CM | POA: Diagnosis not present

## 2023-01-13 ENCOUNTER — Encounter: Payer: 59 | Admitting: Nurse Practitioner

## 2023-01-13 ENCOUNTER — Telehealth: Payer: Self-pay | Admitting: Nurse Practitioner

## 2023-01-13 ENCOUNTER — Encounter: Payer: Self-pay | Admitting: Nurse Practitioner

## 2023-01-13 NOTE — Telephone Encounter (Signed)
2nd no show, fee generated, letter sent via mail and mychart, text sent   No shows 02/19/2022 and 01/13/2023

## 2023-01-13 NOTE — Telephone Encounter (Signed)
Noted  

## 2023-02-02 ENCOUNTER — Other Ambulatory Visit (HOSPITAL_COMMUNITY): Payer: Self-pay

## 2023-02-02 ENCOUNTER — Encounter: Payer: Self-pay | Admitting: Nurse Practitioner

## 2023-02-02 ENCOUNTER — Ambulatory Visit: Payer: 59 | Admitting: Nurse Practitioner

## 2023-02-02 VITALS — BP 100/80 | HR 91 | Temp 98.4°F | Ht 62.0 in | Wt 125.0 lb

## 2023-02-02 DIAGNOSIS — R0982 Postnasal drip: Secondary | ICD-10-CM | POA: Diagnosis not present

## 2023-02-02 DIAGNOSIS — L700 Acne vulgaris: Secondary | ICD-10-CM | POA: Diagnosis not present

## 2023-02-02 MED ORDER — FLUTICASONE PROPIONATE 50 MCG/ACT NA SUSP
2.0000 | Freq: Every day | NASAL | 6 refills | Status: DC
  Filled 2023-02-02: qty 16, 30d supply, fill #0

## 2023-02-02 NOTE — Patient Instructions (Addendum)
It was great to see you!  Start flonase nasal spray 2 sprays in each nostril once a day.   Start either claritin or zyrtec or the generic (loratadine or cetirizine) 1 tablet daily  Drink plenty of fluids.   I have placed a referral to dermatology, they will call you to schedule.   Let's follow-up if your symptoms worsen or don't improve.   Take care,  Rodman Pickle, NP

## 2023-02-02 NOTE — Progress Notes (Signed)
Acute Office Visit  Subjective:     Patient ID: Bethany Cox, female    DOB: 1996/04/15, 27 y.o.   MRN: [id number]  Chief Complaint  Patient presents with   Cough    Persistent cough, pnd    HPI Patient is in today for cough for the last 2 months. She states that it started as a URI that had a lot of nasal congestion and mucus for about 2.5 weeks. She started feeling better and then started coughing. She started delsym, halls cough drops, with minimal relief. She states the cough was worse at night and at work. The cough started to subside in June, however she is still having some random coughing spells. She states that she can still feel some mucus in her throat that she will cough up. Then last Wednesday, she has more nasal congestion, coughing, and headache. She is feeling better today, but feels like it will get worse again in a few days. She is not currently taking any medication. She denies fevers, shortness of breath, and wheezing.   She also notes that she has been having worsening acne on her face. She states that she had hormonal acne and was on birth control which was helping, however was having side effects and abnormal uterine bleeding. She states the acne is causing scarring on her face and she would like a referral to a dermatologist.   ROS See pertinent positives and negatives per HPI.     Objective:    BP 100/80   Pulse 91   Temp 98.4 F (36.9 C) (Oral)   Ht 5\' 2"  (1.575 m)   Wt 125 lb (56.7 kg)   LMP 02/01/2023 (Exact Date)   SpO2 97%   BMI 22.86 kg/m    Physical Exam Vitals and nursing note reviewed.  Constitutional:      General: She is not in acute distress.    Appearance: Normal appearance.  HENT:     Head: Normocephalic.     Right Ear: Tympanic membrane, ear canal and external ear normal.     Left Ear: Tympanic membrane, ear canal and external ear normal.     Nose:     Right Sinus: No maxillary sinus tenderness or frontal sinus tenderness.      Left Sinus: No maxillary sinus tenderness or frontal sinus tenderness.  Eyes:     Conjunctiva/sclera: Conjunctivae normal.  Cardiovascular:     Rate and Rhythm: Normal rate and regular rhythm.     Pulses: Normal pulses.     Heart sounds: Normal heart sounds.  Pulmonary:     Effort: Pulmonary effort is normal.     Breath sounds: Normal breath sounds.  Musculoskeletal:     Cervical back: Normal range of motion.  Skin:    General: Skin is warm.     Comments: Acne along forehead  Neurological:     General: No focal deficit present.     Mental Status: She is alert and oriented to person, place, and time.  Psychiatric:        Mood and Affect: Mood normal.        Behavior: Behavior normal.        Thought Content: Thought content normal.        Judgment: Judgment normal.       Assessment & Plan:   Problem List Items Addressed This Visit       Musculoskeletal and Integument   Acne vulgaris    She has been experiencing worsening  acne across her forehead.  She states that it does leave scars, however her face is covered in make-up today making it hard to visualize.  She was taking birth control which helped in the past, however it caused her to have irregular bleeding so she had to stop it.  She would like a referral to dermatology, referral placed today.      Relevant Orders   Ambulatory referral to Dermatology     Other   Post-nasal drip - Primary    Start flonase nasal spray daily and either loratadine or cetirizine 10mg  daily. Encourage fluids. Follow-up if not improving.        Meds ordered this encounter  Medications   fluticasone (FLONASE) 50 MCG/ACT nasal spray    Sig: Place 2 sprays into both nostrils daily.    Dispense:  16 g    Refill:  6    Return if symptoms worsen or fail to improve.  Gerre Scull, NP

## 2023-02-03 DIAGNOSIS — L700 Acne vulgaris: Secondary | ICD-10-CM | POA: Insufficient documentation

## 2023-02-03 DIAGNOSIS — R0982 Postnasal drip: Secondary | ICD-10-CM | POA: Insufficient documentation

## 2023-02-03 NOTE — Assessment & Plan Note (Signed)
Start flonase nasal spray daily and either loratadine or cetirizine 10mg  daily. Encourage fluids. Follow-up if not improving.

## 2023-02-03 NOTE — Assessment & Plan Note (Signed)
She has been experiencing worsening acne across her forehead.  She states that it does leave scars, however her face is covered in make-up today making it hard to visualize.  She was taking birth control which helped in the past, however it caused her to have irregular bleeding so she had to stop it.  She would like a referral to dermatology, referral placed today.

## 2023-02-09 ENCOUNTER — Telehealth: Payer: Self-pay | Admitting: Nurse Practitioner

## 2023-02-09 ENCOUNTER — Encounter: Payer: 59 | Admitting: Nurse Practitioner

## 2023-02-09 NOTE — Telephone Encounter (Signed)
Pt was a no show for a physical with Lauren on 02/09/23, I did not send a letter.  She may be dismissed.

## 2023-02-10 ENCOUNTER — Other Ambulatory Visit (HOSPITAL_COMMUNITY): Payer: Self-pay

## 2023-02-22 NOTE — Telephone Encounter (Signed)
3rd no show (02/19/22, 01/13/23, 02/09/23) Final warning letter sent to pt via mail and mychart 01/13/23. Do you want to proceed with dismissal?

## 2023-02-23 ENCOUNTER — Encounter: Payer: Self-pay | Admitting: Nurse Practitioner

## 2023-02-23 NOTE — Telephone Encounter (Signed)
Dismissal generated 

## 2023-03-07 ENCOUNTER — Telehealth: Payer: Self-pay | Admitting: Nurse Practitioner

## 2023-03-07 NOTE — Telephone Encounter (Signed)
She called disputing she missed the appt for CPE on 7/10 she stated that when she came for her appt on 7/3 she asked for that to be cancelled. She would like to speak to you about this.

## 2023-03-15 NOTE — Telephone Encounter (Signed)
Called Pt she is going out of town and will book CPE when she returns.

## 2023-03-15 NOTE — Telephone Encounter (Signed)
reversed dismissal, pt stated 7/3 when in office she requested this appt 7/10 to be cancelled.   I have sent letter via MyChart. Please call pt and offer to RS her physical & notify pt additional missed visits will result in dismissal, as stated in letter.

## 2023-03-15 NOTE — Telephone Encounter (Signed)
Noted  

## 2023-05-12 ENCOUNTER — Ambulatory Visit: Payer: 59 | Admitting: Obstetrics and Gynecology

## 2023-05-24 ENCOUNTER — Ambulatory Visit: Payer: 59 | Admitting: Obstetrics and Gynecology

## 2023-05-31 ENCOUNTER — Ambulatory Visit: Payer: 59 | Admitting: Obstetrics and Gynecology

## 2023-09-16 ENCOUNTER — Ambulatory Visit: Payer: 59 | Admitting: Obstetrics and Gynecology

## 2023-12-16 ENCOUNTER — Encounter: Payer: Self-pay | Admitting: Physician Assistant

## 2023-12-16 ENCOUNTER — Ambulatory Visit: Payer: Self-pay | Admitting: Physician Assistant

## 2023-12-16 ENCOUNTER — Other Ambulatory Visit (HOSPITAL_COMMUNITY)
Admission: RE | Admit: 2023-12-16 | Discharge: 2023-12-16 | Disposition: A | Discharge status: Home / Self Care | Source: Ambulatory Visit | Attending: Physician Assistant | Admitting: Physician Assistant

## 2023-12-16 VITALS — BP 133/90 | HR 88 | Ht 62.0 in | Wt 129.1 lb

## 2023-12-16 DIAGNOSIS — Z30011 Encounter for initial prescription of contraceptive pills: Secondary | ICD-10-CM | POA: Diagnosis not present

## 2023-12-16 DIAGNOSIS — Z01419 Encounter for gynecological examination (general) (routine) without abnormal findings: Secondary | ICD-10-CM

## 2023-12-16 DIAGNOSIS — Z124 Encounter for screening for malignant neoplasm of cervix: Secondary | ICD-10-CM

## 2023-12-16 DIAGNOSIS — Z3202 Encounter for pregnancy test, result negative: Secondary | ICD-10-CM | POA: Diagnosis not present

## 2023-12-16 DIAGNOSIS — Z113 Encounter for screening for infections with a predominantly sexual mode of transmission: Secondary | ICD-10-CM | POA: Insufficient documentation

## 2023-12-16 DIAGNOSIS — F321 Major depressive disorder, single episode, moderate: Secondary | ICD-10-CM | POA: Diagnosis not present

## 2023-12-16 DIAGNOSIS — F419 Anxiety disorder, unspecified: Secondary | ICD-10-CM

## 2023-12-16 LAB — POCT URINE PREGNANCY: Preg Test, Ur: NEGATIVE

## 2023-12-16 NOTE — Progress Notes (Signed)
 ANNUAL EXAM Patient name: Bethany Cox MRN [id number]  Date of birth: 03-18-1996 Chief Complaint:   No chief complaint on file.  History of Present Illness:   Bethany Cox is a 28 y.o. G0P0000 female being seen today for a routine annual exam.   Current complaints: Patient attending religious retreat and unable to participate if having menses/vaginal bleeding. Would like hormonal treatment that will temporarily pause bleeding for one month, and prefers OCPs to any other form. She denies any known history of HTN, DM, DVT/PE, liver/gallbladder disease, migraines with aura. She does not smoke.   Patient's last menstrual period was 11/27/2023 (exact date).  The pregnancy intention screening data noted above was reviewed. Potential methods of contraception were discussed. The patient elected to proceed with No data recorded.   Last pap 05/21/2021 NILM. H/O abnormal pap: no Last mammogram: Not yet done due to age.Family h/o breast cancer: no Last colonoscopy: Not yet done due to age. Family h/o colorectal cancer: no STI screening: Accepts Contraception: None     12/16/2023    8:49 AM 11/16/2022    4:03 PM 11/19/2021    9:51 AM 05/21/2021   10:21 AM  Depression screen PHQ 2/9  Decreased Interest 2 2 2 2   Down, Depressed, Hopeless 2 2 2 3   PHQ - 2 Score 4 4 4 5   Altered sleeping 3 3 2  0  Tired, decreased energy 3 3 3 3   Change in appetite 1 0 1 1  Feeling bad or failure about yourself  2 2 3 3   Trouble concentrating 2 1 0 1  Moving slowly or fidgety/restless 1 0 0 2  Suicidal thoughts 2 0 0 1  PHQ-9 Score 18 13 13 16   Difficult doing work/chores  Not difficult at all Somewhat difficult Not difficult at all        12/16/2023    8:50 AM 11/19/2021    9:52 AM  GAD 7 : Generalized Anxiety Score  Nervous, Anxious, on Edge 2 0  Control/stop worrying 2 2  Worry too much - different things 2 2  Trouble relaxing 1 3  Restless 1 0  Easily annoyed or irritable 3 3  Afraid -  awful might happen 3 0  Total GAD 7 Score 14 10  Anxiety Difficulty  Somewhat difficult     Review of Systems:   Pertinent items are noted in HPI Denies any headaches, blurred vision, fatigue, shortness of breath, chest pain, abdominal pain, abnormal vaginal discharge/itching/odor/irritation, problems with periods, bowel movements, urination, or intercourse unless otherwise stated above. Pertinent History Reviewed:  Reviewed past medical,surgical, social and family history.  Reviewed problem list, medications and allergies. Physical Assessment:   Vitals:   12/16/23 0816 12/16/23 0837  BP: (!) 132/94 (!) 133/90  Pulse: 72 88  Weight: 129 lb 1.6 oz (58.6 kg)   Height: 5\' 2"  (1.575 m)   Body mass index is 23.61 kg/m.        Physical Examination:   General appearance - well appearing, and in no distress  Mental status - alert, oriented to person, place, and time  Psych:  She has a normal mood and affect  Skin - warm and dry, normal color, no suspicious lesions noted  Chest - effort normal, all lung fields clear to auscultation bilaterally  Heart - normal rate and regular rhythm  Neck:  midline trachea, no thyromegaly or nodules  Breasts - breasts appear normal, no suspicious masses, no skin or nipple changes or  axillary nodes  Abdomen - soft, nontender, nondistended, no masses or organomegaly  Pelvic - VULVA: normal appearing vulva with no masses, tenderness or lesions  VAGINA: normal appearing vagina with normal color and discharge, no lesions  CERVIX: normal appearing cervix without discharge or lesions, no CMT  Thin prep pap is done without HR HPV cotesting  UTERUS: uterus is felt to be normal size, shape, consistency and nontender   ADNEXA: No adnexal masses or tenderness noted.  Extremities:  No swelling or varicosities noted  Chaperone present for exam  No results found for this or any previous visit (from the past 24 hours).  Assessment & Plan:   1. Encounter for  annual routine gynecological examination (Primary) 2. Cervical cancer screening - Cervical cancer screening: Discussed screening Q3 years. Reviewed importance of annual exams and limits of pap smear. Pap smear updated today.  - GC/CT: Discussed and recommended. Pt accepts - Breast Health: Encouraged self breast awareness/exams. Teaching provided. - Mammogram: @ 29yo, or sooner if problems - Colonoscopy: @ 29yo, or sooner if problems - Follow-up: 12 months and prn  - Cytology - PAP( [place])  3. Screening examination for STD (sexually transmitted disease) - Cervicovaginal ancillary only( [place]) - Hepatitis B Surface AntiGEN - Hepatitis C Antibody - HIV antibody (with reflex) - RPR  4. Encounter for initial prescription of contraceptive pills Patient seeking OCPs for both contraceptive use and attending religious ceremony, for which she cannot participate if having vaginal bleeding. She intends to use them continuously for two cycles. We discussed that possible side effect of continuous OCP use is breakthrough bleeding and she stated understanding of this; does not desire IUD. She has no known history of HTN, stroke, DM, DVT/PE, migraines with aura, liver/gallbladder disease by history or review of EMR that contraindicate use of COCPs. She does not smoke.   -We reviewed the advantages and risks (particularly risk of VTE with estrogen containing pills). We discussed possible AE.  - Reviewed that birth control does not protect against STI.  - POCT urine pregnancy: negative - drospirenone-ethinyl estradiol (YASMIN) 3-0.03 MG tablet; Take 1 tablet by mouth daily.  Dispense: 28 tablet; Refill: 11  5. Depression, major, single episode, moderate (HCC) 6. Anxiety Positive PHQ-9 and GAD-7. Patient previously on Prozac , but has self-discontinued use and does not wish to be on medication at this time. She denies thoughts or plans of SI/HI. I advised her to follow-up with [facility]'s behavioral  health therapist with contact information given. She agrees to this follow-up. We discussed [facility] as a resource if she should need urgent care, be in distress, or have thoughts of self-harm; patient given handout that includes [facility] address, contact information.   Orders Placed This Encounter  Procedures   Hepatitis B Surface AntiGEN   Hepatitis C Antibody   HIV antibody (with reflex)   RPR   POCT urine pregnancy    Meds:  Meds ordered this encounter  Medications   drospirenone-ethinyl estradiol (YASMIN) 3-0.03 MG tablet    Sig: Take 1 tablet by mouth daily.    Dispense:  28 tablet    Refill:  11   Follow-up: No follow-ups on file.  Gwenette Wellons E Abdelaziz Westenberger, [place] 12/17/2023 9:49 AM

## 2023-12-16 NOTE — Patient Instructions (Signed)
 To compare other birth control options, visit this website: [email or web address]

## 2023-12-16 NOTE — Progress Notes (Signed)
 Would like to discuss birth control.  Concerned @ BV Feels dry "down there". Is asking how she can skip her period for the next two months due to a religious purposes.  Took Plan B 11/18/23

## 2023-12-17 LAB — HEPATITIS C ANTIBODY: Hep C Virus Ab: NONREACTIVE

## 2023-12-17 LAB — HIV ANTIBODY (ROUTINE TESTING W REFLEX): HIV Screen 4th Generation wRfx: NONREACTIVE

## 2023-12-17 LAB — HEPATITIS B SURFACE ANTIGEN: Hepatitis B Surface Ag: NEGATIVE

## 2023-12-17 LAB — RPR: RPR Ser Ql: NONREACTIVE

## 2023-12-17 MED ORDER — DROSPIRENONE-ETHINYL ESTRADIOL 3-0.03 MG PO TABS: 1.0000 | ORAL_TABLET | Freq: Every day | ORAL | 11 refills | Status: AC

## 2023-12-20 ENCOUNTER — Ambulatory Visit: Payer: Self-pay | Admitting: Advanced Practice Midwife

## 2023-12-20 ENCOUNTER — Ambulatory Visit (HOSPITAL_COMMUNITY): Payer: Self-pay | Admitting: Physician Assistant

## 2023-12-20 ENCOUNTER — Other Ambulatory Visit (HOSPITAL_COMMUNITY): Payer: Self-pay

## 2023-12-20 ENCOUNTER — Other Ambulatory Visit (HOSPITAL_BASED_OUTPATIENT_CLINIC_OR_DEPARTMENT_OTHER): Payer: Self-pay

## 2023-12-20 ENCOUNTER — Other Ambulatory Visit: Payer: Self-pay

## 2023-12-20 DIAGNOSIS — B9689 Other specified bacterial agents as the cause of diseases classified elsewhere: Secondary | ICD-10-CM

## 2023-12-20 LAB — CERVICOVAGINAL ANCILLARY ONLY
Bacterial Vaginitis (gardnerella): POSITIVE — AB
Candida Glabrata: NEGATIVE
Candida Vaginitis: NEGATIVE
Chlamydia: NEGATIVE
Comment: NEGATIVE
Comment: NEGATIVE
Comment: NEGATIVE
Comment: NEGATIVE
Comment: NEGATIVE
Comment: NORMAL
Neisseria Gonorrhea: NEGATIVE
Trichomonas: NEGATIVE

## 2023-12-20 LAB — CYTOLOGY - PAP: Diagnosis: NEGATIVE

## 2023-12-20 MED ORDER — METRONIDAZOLE 500 MG PO TABS
500.0000 mg | ORAL_TABLET | Freq: Two times a day (BID) | ORAL | 0 refills | Status: AC
Start: 2023-12-20 — End: 2023-12-27

## 2023-12-20 MED ORDER — NORETHINDRONE ACETATE 5 MG PO TABS
ORAL_TABLET | ORAL | 0 refills | Status: DC
Start: 2023-12-20 — End: 2024-02-09
  Filled 2023-12-20: qty 30, 10d supply, fill #0

## 2024-01-04 ENCOUNTER — Telehealth: Payer: Self-pay

## 2024-01-04 ENCOUNTER — Encounter: Payer: Self-pay | Admitting: Physician Assistant

## 2024-01-04 NOTE — Telephone Encounter (Signed)
 S/w pt and she stated that her insurance has changed and the old plan ended on Monday and new plan will start in a few days. Pt made appt for next week to have labial cyst evaluated. Advised that if symptoms get worse, follow up with urgent care/ed

## 2024-01-11 ENCOUNTER — Ambulatory Visit: Payer: Self-pay | Admitting: Obstetrics & Gynecology

## 2024-01-12 ENCOUNTER — Ambulatory Visit (INDEPENDENT_AMBULATORY_CARE_PROVIDER_SITE_OTHER): Payer: PRIVATE HEALTH INSURANCE | Admitting: Physician Assistant

## 2024-01-12 ENCOUNTER — Encounter: Payer: Self-pay | Admitting: Physician Assistant

## 2024-01-12 VITALS — BP 130/86 | HR 91 | Wt 130.8 lb

## 2024-01-12 DIAGNOSIS — N898 Other specified noninflammatory disorders of vagina: Secondary | ICD-10-CM

## 2024-01-12 MED ORDER — REVAREE 0.25 % VA SUPP: 1.0000 | VAGINAL | 2 refills | Status: DC

## 2024-01-12 NOTE — Progress Notes (Signed)
 Bled June 2-4th; not sure if side effect from med to stop menses.   Started birth control pill Sunday 01/08/24. Needs to speak to provider about RX. No complaints.   Experiencing vaginal dryness. Here for possible labial cyst eval of the right side.

## 2024-01-12 NOTE — Progress Notes (Signed)
 GYNECOLOGY  VISIT   HPI: Bethany Cox is a 28 y.o.  female G0P0000 here for possible cyst on right labia. She noticed vulvar discomfort on 6/4 or 6/5 and describes burning, inflammation, swelling, irritation of the right labia and noticed a bump on R inner labia without drainage. Worsened when wearing thong underwear, tight clothing. She no longer feels any symptoms or bump there as of today. She endorses new sexual partner in visit interim, though no genital contact. She was recently treated for bacterial vaginosis with flagyl  500 mg x7d. Denies fever, n/v, malodor, urinary sxs, pain  GYNECOLOGIC HISTORY: Patient's last menstrual period was 11/27/2023 (exact date). Contraception: OCP (estrogen/progesterone).   Menopausal hormone therapy:  premenopausal Last mammogram:  Never done due to age Last pap smear:  Diagnosis  Date Value Ref Range Status  12/16/2023   Final   - Negative for intraepithelial lesion or malignancy (NILM)           OB History     Gravida  0   Para  0   Term  0   Preterm  0   AB  0   Living  0      SAB  0   IAB  0   Ectopic  0   Multiple  0   Live Births  0              Patient Active Problem List   Diagnosis Date Noted   Post-nasal drip 02/03/2023   Acne vulgaris 02/03/2023   Tension headache 11/19/2021   Pure hypercholesterolemia 11/19/2021   Depression, major, single episode, moderate (HCC) 11/19/2021   Anxiety 11/19/2021   Subacute cough 11/19/2021    Past Medical History:  Diagnosis Date   Anemia    Anxiety    COVID-19    Depression    Headache     Past Surgical History:  Procedure Laterality Date   NO PAST SURGERIES      Current Outpatient Medications  Medication Sig Dispense Refill   drospirenone -ethinyl estradiol  (YASMIN ) 3-0.03 MG tablet Take 1 tablet by mouth daily. 28 tablet 11   fluticasone  (FLONASE ) 50 MCG/ACT nasal spray Place 2 sprays into both nostrils daily. (Patient not taking: Reported on  12/16/2023) 16 g 6   melatonin 5 MG TABS Take 5 mg by mouth at bedtime as needed. (Patient not taking: Reported on 11/12/2021)     norethindrone  (AYGESTIN ) 5 MG tablet Start 3 days before your expected period. Take 1 tablet by mouth three times a day for 10 days. 30 tablet 0   No current facility-administered medications for this visit.     ALLERGIES: Shellfish allergy  Family History  Problem Relation Age of Onset   Thyroid disease Mother    Drug abuse Father    Hypertension Father    Hypertension Son    Hypertension Maternal Grandmother    Diabetes Paternal Grandmother    Hypertension Paternal Grandmother    Diabetes Paternal Grandfather    Cancer Paternal Grandfather        stomach    Social History   Socioeconomic History   Marital status: Significant Other    Spouse name: Not on file   Number of children: 0   Years of education: Not on file   Highest education level: Not on file  Occupational History   Not on file  Tobacco Use   Smoking status: Never   Smokeless tobacco: Never  Vaping Use   Vaping status: Never Used  Substance  and Sexual Activity   Alcohol use: Yes    Comment: 1-2x per month   Drug use: Never   Sexual activity: Yes    Birth control/protection: None  Other Topics Concern   Not on file  Social History Narrative   Not on file   Social Drivers of Health   Financial Resource Strain: Not on file  Food Insecurity: Not on file  Transportation Needs: Not on file  Physical Activity: Not on file  Stress: Not on file  Social Connections: Not on file  Intimate Partner Violence: Not on file    Review of Systems  PHYSICAL EXAMINATION:    LMP 11/27/2023 (Exact Date)     General appearance: alert, cooperative and appears stated age Head: Normocephalic, without obvious abnormality, atraumatic Lungs: clear to auscultation bilaterally Heart: regular rate and rhythm Extremities: extremities normal, atraumatic, no cyanosis or edema Skin: Skin color,  texture, turgor normal. No rashes or lesions Lymph nodes: Cervical, supraclavicular lymph nodes normal. No abnormal inguinal nodes palpated.  Neurologic: Grossly normal  Pelvic: External genitalia:  no lesions              Urethra:  normal appearing urethra with no masses, tenderness or lesions              Bartholins and Skenes: normal                 Vagina: +Scant amount of thick, white discharge. Irritation of inner aspects of labia minora apparent. Otherwise normal appearing vagina with normal color and discharge. No lesions.               Cervix: no lesions               Chaperone was present for exam  ASSESSMENT & PLAN   1. Vaginal discharge (Primary) Patient initially presented with history of vulvar bump x1 week with associated irritation, swelling of labia minora, burning, vulvar pain. I was not able to visualize any lesions during pelvic exam today, but scant amounts of thick, white vaginal discharge and vaginal irritation on exam are congruent with yeast and history.  Also s/p recent antibiotic use. Patient agreed that bump has regressed as well. Will treat appropriate pending results of swabs today.   2. Vaginal dryness Patient with concern for vaginal dryness x several months despite endorsing arousal, recently began OCPs, without history or clinical signs of low estrogen. Will start with vaginal moisturizer used regularly to relieve dryness and discomfort. May consider vaginal estrogen in the future if not improved. Discussed lifestyle modifications likely to help with this issue and included patient education in AVS.   An After Visit Summary was printed and given to the patient.  Chael Urenda E Tray Klayman, PA-C 6/12/20257:11 AM

## 2024-01-12 NOTE — Patient Instructions (Signed)
 Vaginal dryness is a common problem that can affect women before menopause. It can cause discomfort, itching, burning, and pain during sex. This can happen for many reasons, including hormonal changes, certain medications, stress, or medical treatments.   What causes vaginal dryness?  -Hormonal changes, such as those from birth control pills, breastfeeding, or certain medical conditions -Medications like antihistamines, antidepressants, or treatments for cancer -Smoking or high stress levels -Some health conditions, such as autoimmune disease  Symptoms to look for:  -Dryness or irritation in the vaginal area -Itching or burning  -Pain during sex -Sometimes, mild bleeding after sex  What can help? -Vaginal moisturizers: These are used regularly (3-5 times per week) to keep the vaginal tissues hydrated. They can help relieve dryness and discomfort.  -Lubricants: These are used during sexual activity to reduce friction and pain. Water-based lubricants with hyaluronic acid have been shown to improve dryness and sexual comfort.  -Lifestyle changes: Avoiding scented soaps, douches, wearing cotton underwear can help reduce irritation.  -Regular sexual activity: This can help keep vaginal tissues healthy by increasing blood flow.   If symptoms do not improve:  -For women who do not get relief with moisturizers and lubricants, a health care provider may suggest low-dose vaginal estrogen or other hormone-based treatments. These are usually considered only after other options have not worked and after discussing the risks and benefits.  -Non-hormonal options like vaginal hyaluronic acid are available for those who cannot or do not want to use hormones.  -In some cases, other medications or therapies may be recommended, depending on the cause of dryness.   Remember:  Vaginal dryness is common and treatable. Many women feel embarrassed to talk about it, but help is available, and effective treatments  exist.

## 2024-01-13 ENCOUNTER — Telehealth: Payer: Self-pay | Admitting: Physician Assistant

## 2024-01-13 NOTE — Telephone Encounter (Signed)
 Telephone call to patient regarding lab specimen error.  Patient scheduled to come in on 01/16/24 for recollection.

## 2024-01-16 ENCOUNTER — Ambulatory Visit: Payer: PRIVATE HEALTH INSURANCE

## 2024-01-16 ENCOUNTER — Other Ambulatory Visit (HOSPITAL_COMMUNITY)
Admission: RE | Admit: 2024-01-16 | Discharge: 2024-01-16 | Disposition: A | Discharge status: Home / Self Care | Payer: PRIVATE HEALTH INSURANCE | Source: Ambulatory Visit | Attending: Obstetrics and Gynecology | Admitting: Obstetrics and Gynecology

## 2024-01-16 VITALS — BP 118/80 | HR 90

## 2024-01-16 DIAGNOSIS — N898 Other specified noninflammatory disorders of vagina: Secondary | ICD-10-CM | POA: Diagnosis present

## 2024-01-16 DIAGNOSIS — Z113 Encounter for screening for infections with a predominantly sexual mode of transmission: Secondary | ICD-10-CM | POA: Insufficient documentation

## 2024-01-16 NOTE — Progress Notes (Signed)
 Patient presents for recollection of vaginal swab due to lab specimen error. See providers note 01/12/24 for indications.

## 2024-01-17 ENCOUNTER — Encounter: Payer: Self-pay | Admitting: Physician Assistant

## 2024-01-18 ENCOUNTER — Other Ambulatory Visit: Payer: Self-pay

## 2024-01-18 LAB — CERVICOVAGINAL ANCILLARY ONLY
Bacterial Vaginitis (gardnerella): NEGATIVE
Candida Glabrata: NEGATIVE
Candida Vaginitis: POSITIVE — AB
Chlamydia: NEGATIVE
Comment: NEGATIVE
Comment: NEGATIVE
Comment: NEGATIVE
Comment: NEGATIVE
Comment: NEGATIVE
Comment: NORMAL
Neisseria Gonorrhea: NEGATIVE
Trichomonas: NEGATIVE

## 2024-01-18 MED ORDER — FLUCONAZOLE 150 MG PO TABS: 150.0000 mg | ORAL_TABLET | ORAL | 0 refills | Status: DC

## 2024-01-20 ENCOUNTER — Ambulatory Visit: Payer: Self-pay | Admitting: Physician Assistant

## 2024-02-09 ENCOUNTER — Encounter: Payer: Self-pay | Admitting: Internal Medicine

## 2024-02-09 ENCOUNTER — Ambulatory Visit: Payer: PRIVATE HEALTH INSURANCE | Admitting: Internal Medicine

## 2024-02-09 VITALS — BP 120/78 | HR 90 | Temp 98.2°F | Ht 62.0 in | Wt 130.8 lb

## 2024-02-09 DIAGNOSIS — L299 Pruritus, unspecified: Secondary | ICD-10-CM

## 2024-02-09 DIAGNOSIS — R109 Unspecified abdominal pain: Secondary | ICD-10-CM | POA: Diagnosis not present

## 2024-02-09 LAB — POC URINALSYSI DIPSTICK (AUTOMATED)
Bilirubin, UA: NEGATIVE
Blood, UA: POSITIVE
Glucose, UA: NEGATIVE
Ketones, UA: NEGATIVE
Nitrite, UA: NEGATIVE
Protein, UA: NEGATIVE
Spec Grav, UA: 1.02 (ref 1.010–1.025)
Urobilinogen, UA: 0.2 U/dL
pH, UA: 6 (ref 5.0–8.0)

## 2024-02-09 LAB — POCT PREGNANCY, URINE

## 2024-02-09 NOTE — Progress Notes (Signed)
 [facility] [address] [place] [address] Dept: [phone] Dept Fax: [phone]  Acute Care Office Visit  Subjective:   Bethany Cox 07-22-1996 02/09/2024  Chief Complaint  Patient presents with   Pruritis   Dysmenorrhea    Started 3 days ago on legs, last night hands and arms    HPI:  Discussed the use of AI scribe software for clinical note transcription with the patient, who gave verbal consent to proceed.  History of Present Illness   Bethany Cox is a 28 year old female who presents with generalized itching and abdominal cramping.  She has been experiencing extreme pruritus for the past three days, affecting her hands, legs, back, and neck. There are no visible bite marks or rashes. She initially suspected mosquito bites or bed bugs but found no evidence. She recalls contact with a medical grade cleaning 'sani wipe' and consuming crab soup. She is allergic to shellfish. Denies URI symptoms.   In addition to the itching, she has been experiencing abdominal cramping for the past couple days. The cramps are located in the lower abdomen.  The pain is persistent with brief periods of relief. She took ibuprofen, which provided some relief, but the cramps returned the next morning. She recently finished her menstrual period. She recently started a new cycle of birth control pills (Yasmin ) in June, having previously used a different form of contraception several years ago. Last sexual encounter was 2 weeks ago. Has not used back up method during first month on OCP's.  No dysuria, increased urinary frequency, or hematuria. Recently treated for vaginal yeast infection.     The following portions of the patient's history were reviewed and updated as appropriate: past medical history, past surgical history, family history, social history, allergies, medications, and problem list.   Patient Active Problem List    Diagnosis Date Noted   Post-nasal drip 02/03/2023   Acne vulgaris 02/03/2023   Tension headache 11/19/2021   Pure hypercholesterolemia 11/19/2021   Depression, major, single episode, moderate (HCC) 11/19/2021   Anxiety 11/19/2021   Subacute cough 11/19/2021   Past Medical History:  Diagnosis Date   Anemia    Anxiety    COVID-19    Depression    Headache    Past Surgical History:  Procedure Laterality Date   NO PAST SURGERIES     Family History  Problem Relation Age of Onset   Thyroid disease Mother    Drug abuse Father    Hypertension Father    Hypertension Son    Hypertension Maternal Grandmother    Diabetes Paternal Grandmother    Hypertension Paternal Grandmother    Diabetes Paternal Grandfather    Cancer Paternal Grandfather        stomach    Current Outpatient Medications:    drospirenone -ethinyl estradiol  (YASMIN ) 3-0.03 MG tablet, Take 1 tablet by mouth daily., Disp: 28 tablet, Rfl: 11   fluticasone  (FLONASE ) 50 MCG/ACT nasal spray, Place 2 sprays into both nostrils daily. (Patient not taking: Reported on 02/09/2024), Disp: 16 g, Rfl: 6   melatonin 5 MG TABS, Take 5 mg by mouth at bedtime as needed. (Patient not taking: Reported on 02/09/2024), Disp: , Rfl:    Vaginal Lubricant (REVAREE) 0.25 % SUPP, Place 1 suppository vaginally every 3 (three) days. (Patient not taking: Reported on 02/09/2024), Disp: 12 suppository, Rfl: 2 Allergies  Allergen Reactions   Shellfish Allergy Itching and Swelling     ROS: A complete ROS was performed  with pertinent positives/negatives noted in the HPI. The remainder of the ROS are negative.    Objective:   Today's Vitals   02/09/24 1354  BP: 120/78  Pulse: 90  Temp: 98.2 F (36.8 C)  TempSrc: Temporal  SpO2: 99%  Weight: 130 lb 12.8 oz (59.3 kg)  Height: 5' 2 (1.575 m)    GENERAL: Well-appearing, in NAD. Well nourished.  SKIN: Pink, warm and dry. No rash, lesion, ulceration, or ecchymoses.  NECK: Trachea midline.  Full ROM w/o pain or tenderness. No lymphadenopathy.  RESPIRATORY: Chest wall symmetrical. Respirations even and non-labored. Breath sounds clear to auscultation bilaterally.  CARDIAC: S1, S2 present, regular rate and rhythm. Peripheral pulses 2+ bilaterally.  GI: Abdomen soft, non-tender. Normoactive bowel sounds. No rebound tenderness. No hepatomegaly or splenomegaly. No CVA tenderness.  EXTREMITIES: Without clubbing, cyanosis, or edema.  NEUROLOGIC: No motor or sensory deficits. Steady, even gait.  PSYCH/MENTAL STATUS: Alert, oriented x 3. Cooperative, appropriate mood and affect.    Results for orders placed or performed in visit on 02/09/24  POCT Urinalysis Dipstick (Automated)  Result Value Ref Range   Color, UA yellow    Clarity, UA clear    Glucose, UA Negative Negative   Bilirubin, UA neg    Ketones, UA neg    Spec Grav, UA 1.020 1.010 - 1.025   Blood, UA pos    pH, UA 6.0 5.0 - 8.0   Protein, UA Negative Negative   Urobilinogen, UA 0.2 0.2 or 1.0 E.U./dL   Nitrite, UA neg    Leukocytes, UA Small (1+) (A) Negative  POCT Pregnancy, Urine  Result Value Ref Range   Negative        Assessment & Plan:  Assessment and Plan    Abdominal Cramping Severe lower abdominal cramping, unrelated to menstrual cycle. Differential includes possible UTI or birth control side effects. Discussed potential adjustment to new birth control as a cause. - Send urine sample for culture to rule out UTI. - Recommend ibuprofen for pain relief. - Advise contacting OB if cramping persists despite treatment. Recently started Yasmin  birth control. Negative pregnancy test today. Discussed importance of backup contraceptive method and rechecking pregnancy test in a couple weeks ago home.  - Use a backup contraceptive method for the next cycle.  Generalized Pruritus Generalized itching without visible rash or bite marks. Possible cause includes histamine-mediated response. Discussed antihistamines  considering non-drowsy options due to work concerns. - Recommend Zyrtec once daily and Benadryl at night to alleviate itching. - Suggest oatmeal baths to soothe the skin. - Consider hydrocortisone cream for localized itching. - Advise trying Claritin or Allegra if concerned about drowsiness from Zyrtec.       No orders of the defined types were placed in this encounter.  Orders Placed This Encounter  Procedures   Urine Culture   POCT Urinalysis Dipstick (Automated)   Lab Orders         Urine Culture         POCT Urinalysis Dipstick (Automated)     No images are attached to the encounter or orders placed in the encounter.  Return if symptoms worsen or fail to improve.   Rosina Senters, FNP

## 2024-02-09 NOTE — Patient Instructions (Addendum)
 Itching:  Zyrtec OR Claritin OR Allegra . Take once daily.  Oatmeal baths  Hydrocortisone cream as needed   Abdominal cramping:  Could be from birth control - if it persists, call your OBGYN Sending your urine sample off for culture to ensure no UTI Use back up method for next cycle for birth control

## 2024-02-10 LAB — URINE CULTURE
MICRO NUMBER:: 16682494
Result:: NO GROWTH
SPECIMEN QUALITY:: ADEQUATE

## 2024-02-13 ENCOUNTER — Ambulatory Visit: Payer: Self-pay | Admitting: Internal Medicine

## 2024-04-09 ENCOUNTER — Other Ambulatory Visit: Payer: Self-pay

## 2024-05-14 ENCOUNTER — Telehealth: Payer: Self-pay

## 2024-05-14 NOTE — Telephone Encounter (Signed)
 Copied from CRM [id number]. Topic: General - Other >> May 14, 2024 12:01 PM Pinkey ORN wrote: Reason for CRM: Summary Report Request >> May 14, 2024 12:03 PM Pinkey ORN wrote: Patient is requesting to have her summary report sent to her from Sept 25th when she received her flu vaccine. Please follow up with patient. Patient is requesting it be sent via email. [email or web address]

## 2024-05-14 NOTE — Telephone Encounter (Signed)
 Copied from CRM [id number]. Topic: General - Other >> May 14, 2024  1:34 PM Berneda FALCON wrote: Reason for CRM: Patient is returning phone call to [place] regarding medical records.  Called the CAL and stated that she was in with a patient. Patient callback is [phone] (home)

## 2024-05-14 NOTE — Telephone Encounter (Signed)
 Left message for patient to return call.

## 2024-05-14 NOTE — Telephone Encounter (Signed)
 Left message to return call

## 2024-05-15 NOTE — Telephone Encounter (Signed)
 Left a detailed message that Influenza vaccine was not given at this office and to please contact office that was given at to get proper documentation.

## 2024-08-07 ENCOUNTER — Ambulatory Visit: Payer: PRIVATE HEALTH INSURANCE | Admitting: Internal Medicine

## 2024-08-07 ENCOUNTER — Encounter: Payer: Self-pay | Admitting: Family Medicine

## 2024-08-07 ENCOUNTER — Other Ambulatory Visit (HOSPITAL_COMMUNITY): Payer: Self-pay

## 2024-08-07 ENCOUNTER — Ambulatory Visit: Admitting: Family Medicine

## 2024-08-07 VITALS — BP 110/70 | HR 101 | Ht 62.0 in | Wt 135.8 lb

## 2024-08-07 DIAGNOSIS — J45998 Other asthma: Secondary | ICD-10-CM

## 2024-08-07 DIAGNOSIS — B309 Viral conjunctivitis, unspecified: Secondary | ICD-10-CM

## 2024-08-07 MED ORDER — CIPROFLOXACIN HCL 0.3 % OP SOLN
2.0000 [drp] | OPHTHALMIC | 0 refills | Status: AC
  Filled 2024-08-07: qty 10, 20d supply, fill #0

## 2024-08-07 MED ORDER — BUDESONIDE-FORMOTEROL FUMARATE 160-4.5 MCG/ACT IN AERO: 2.0000 | INHALATION_SPRAY | Freq: Two times a day (BID) | RESPIRATORY_TRACT | 3 refills | Status: DC

## 2024-08-09 ENCOUNTER — Telehealth: Payer: Self-pay | Admitting: Nurse Practitioner

## 2024-08-09 NOTE — Telephone Encounter (Unsigned)
 Copied from CRM [id number]. Topic: Clinical - Prescription Issue >> Aug 09, 2024  4:57 PM China J wrote: Reason for CRM: Patient's medication was over $100 and she was wondering if the inhaler could be sent to:  [facility] [place] - [address] [place] [address] Phone: [phone] Fax: [phone] Hours: Not open 24 hours  Please call patient at: [phone] once medication has been transfered.

## 2024-08-10 MED ORDER — BUDESONIDE-FORMOTEROL FUMARATE 160-4.5 MCG/ACT IN AERO: 2.0000 | INHALATION_SPRAY | Freq: Two times a day (BID) | RESPIRATORY_TRACT | 3 refills | Status: AC

## 2024-08-10 NOTE — Telephone Encounter (Signed)
 Left message for patient to return call to verify medication.  I will send Rx to preferred pharmacy.

## 2024-08-12 ENCOUNTER — Encounter: Payer: Self-pay | Admitting: Family Medicine

## 2024-08-12 NOTE — Progress Notes (Signed)
 "    Patient Care Team: Nedra Tinnie LABOR, NP as PCP - General (Internal Medicine)  Diagnoses and Orders:   1. Post-viral reactive airway disease   2. Acute viral conjunctivitis of both eyes    Meds ordered this encounter  Medications   DISCONTD: budesonide -formoterol  (SYMBICORT ) 160-4.5 MCG/ACT inhaler    Sig: Inhale 2 puffs into the lungs 2 (two) times daily.    Dispense:  1 each    Refill:  3   ciprofloxacin  (CILOXAN ) 0.3 % ophthalmic solution    Sig: Administer 1 drop, every 2 hours, while awake, for 2 days. Then 1 drop, every 4 hours, while awake, for the next 5 days.    Dispense:  10 mL    Refill:  0   Assessment & Plan:   Assessment and Plan Assessment & Plan Post-viral reactive airway disease following acute bronchitis and influenza Persistent cough and mucus production post-influenza and bronchitis, likely post-viral reactive airway disease. - Prescribed Symbicort  inhaler, use twice daily for a few weeks. - Continue albuterol  inhaler as needed. - Advised brushing teeth after using Symbicort  to prevent thrush. - Recommended Mucinex DM for mucus and cough management. - Encouraged adequate rest and fluid intake.  Viral conjunctivitis Red eyes and irritation consistent with viral conjunctivitis, likely related to recent viral illness. - Prescribed Cipro  ophthalmic drops by previous provider to use in both eyes every four hours while awake. - Sent prescription to [facility].  Geni Shutter, DO, MS, FAAFP, Dipl. [facility] [address] [place], [address] Dept: [phone] Dept Fax: [phone]  Subjective:   History of Present Illness Bethany Cox is a 29 year old female who presents with persistent cough and respiratory symptoms following a recent flu and bronchitis diagnosis.  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 - Vomiting mucus with blood during initial illness - Ongoing throat mucus, somewhat improved but remains bothersome - Shortness of breath present - No recurrent fevers - Voice previously lost from coughing, now returned  Oropharyngeal and otologic symptoms - Ear pain with swallowing, mainly unilateral - Sensation of facial congestion  Prior treatments and medication use - Received steroid injection, nebulizer treatment, and completed prednisone course during Christmas week - Uses albuterol  during coughing fits, especially at night and while working - Not taking other cough or cold medications due to prior lack of benefit and side effects  Immunization status - 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  History:   Reviewed by clinician on day of visit: allergies, medications, problem list, medical history, surgical history, family history, social history, and previous encounter notes.  Medications:   Show/hide medication list[1] Allergies[2]  Objective:   BP 110/70 (BP Location: Right Arm, Cuff Size: Normal)   Pulse (!) 101   Ht 5' 2 (1.575 m)   Wt 135 lb 12.8 oz (61.6 kg)   LMP 08/02/2024 (Exact Date)   SpO2 98%   BMI 24.84 kg/m   Physical Exam Vitals reviewed.  HENT:     Head: Normocephalic.     Right Ear: Tympanic membrane normal.     Left Ear: Tympanic membrane normal.     Nose: Congestion and rhinorrhea present.     Mouth/Throat:     Pharynx: No oropharyngeal  exudate or posterior oropharyngeal erythema.  Eyes:     Conjunctiva/sclera:     Right eye: Right conjunctiva is not injected.     Left eye: Left conjunctiva is not injected.  Cardiovascular:     Rate and Rhythm: Normal rate and regular rhythm.  Pulmonary:     Breath sounds: Wheezing present.  Skin:    Findings: No rash.  Neurological:     Mental  Status: She is alert.     Results for orders placed or performed in visit on 02/09/24  POCT Urinalysis Dipstick (Automated)   Collection Time: 02/09/24  2:22 PM  Result Value Ref Range   Color, UA yellow    Clarity, UA clear    Glucose, UA Negative Negative   Bilirubin, UA neg    Ketones, UA neg    Spec Grav, UA 1.020 1.010 - 1.025   Blood, UA pos    pH, UA 6.0 5.0 - 8.0   Protein, UA Negative Negative   Urobilinogen, UA 0.2 0.2 or 1.0 E.U./dL   Nitrite, UA neg    Leukocytes, UA Small (1+) (A) Negative  POCT Pregnancy, Urine   Collection Time: 02/09/24  2:23 PM  Result Value Ref Range   Negative    Urine Culture   Collection Time: 02/09/24  2:50 PM   Specimen: Urine  Result Value Ref Range   MICRO NUMBER: [id number]    SPECIMEN QUALITY: Adequate    Sample Source URINE    STATUS: FINAL    Result: No Growth     Attestations:   Reviewed by clinician on day of visit: allergies, medications, problem list, medical history, surgical history, family history, social history, and previous encounter notes.  The patient is being seen today for an acute visit.  Geni Shutter, DO, MS, FAAFP, Dipl. [facility] [address] [place], [address] Dept: [phone] Dept Fax: [phone]    [1]  Outpatient Medications Prior to Visit  Medication Sig   drospirenone -ethinyl estradiol  (YASMIN ) 3-0.03 MG tablet Take 1 tablet by mouth daily.   albuterol  (VENTOLIN  HFA) 108 (90 Base) MCG/ACT inhaler Inhale 1-2 puffs into the lungs every 6 (six) hours as needed.   [DISCONTINUED] fluticasone  (FLONASE ) 50 MCG/ACT nasal spray Place 2 sprays into both nostrils daily. (Patient not taking: Reported on 02/09/2024)   [DISCONTINUED] melatonin 5 MG TABS Take 5 mg by mouth at bedtime as needed. (Patient not taking: Reported on 02/09/2024)   [DISCONTINUED] Vaginal Lubricant (REVAREE) 0.25 % SUPP Place 1 suppository vaginally every 3 (three) days. (Patient  not taking: Reported on 02/09/2024)   No facility-administered medications prior to visit.  [2]  Allergies Allergen Reactions   Shellfish Allergy Itching and Swelling   "

## 2024-08-15 NOTE — Telephone Encounter (Signed)
 I called patient and no answer and no voicemail.  I will send patient a Mychart message to return call.

## 2024-08-16 ENCOUNTER — Other Ambulatory Visit (HOSPITAL_COMMUNITY): Payer: Self-pay

## 2024-08-17 NOTE — Telephone Encounter (Signed)
 I called and left patient a message to return call to give us  the medication that she is requesting to be sent to pharmacy.

## 2024-09-07 ENCOUNTER — Ambulatory Visit: Payer: Self-pay | Admitting: *Deleted

## 2024-09-07 NOTE — Telephone Encounter (Signed)
 Message from [place] D sent at 09/07/2024  8:40 AM EST  Reason for Triage: Pt stated that she is experiencing some pain and discomfort in the pelvis area and wants to get an appt scheduled with a provider.   Caller disconnected before transfer- attempted to call patient back- no answer- left message to call office

## 2024-09-07 NOTE — Telephone Encounter (Signed)
 2nd attempt to contact patient. No answer, left message to return call via [phone]. Call placed back in TQ for additional attempts to contact.   Message from [place] D sent at 09/07/2024  8:40 AM EST  Reason for Triage: Pt stated that she is experiencing some pain and discomfort in the pelvis area and wants to get an appt scheduled with a provider.   Caller disconnected before transfer- attempted to call patient back- no answer- left message to call office

## 2024-09-07 NOTE — Telephone Encounter (Signed)
 Left a detailed message to call the office so we can get her scheduled to be seen by a provider.

## 2024-09-07 NOTE — Telephone Encounter (Signed)
 Attempted to call pt x3. VM left for pt. Routing to clinic for follow up.   Message from Woodward D sent at 09/07/2024  8:40 AM EST  Reason for Triage: Pt stated that she is experiencing some pain and discomfort in the pelvis area and wants to get an appt scheduled with a provider.
